# Patient Record
Sex: Female | Born: 1945 | Race: White | Hispanic: No | Marital: Single | Smoking: Never smoker
Health system: Southern US, Community
[De-identification: ages and names within clinical notes are randomized; demographics above are authoritative.]

## PROBLEM LIST (undated history)

## (undated) DIAGNOSIS — C569 Malignant neoplasm of unspecified ovary: Secondary | ICD-10-CM

## (undated) DIAGNOSIS — C801 Malignant (primary) neoplasm, unspecified: Secondary | ICD-10-CM

## (undated) DIAGNOSIS — I219 Acute myocardial infarction, unspecified: Secondary | ICD-10-CM

## (undated) DIAGNOSIS — E785 Hyperlipidemia, unspecified: Secondary | ICD-10-CM

## (undated) HISTORY — PX: TOTAL ABDOMINAL HYSTERECTOMY W/ BILATERAL SALPINGOOPHORECTOMY: SHX83

## (undated) HISTORY — DX: Acute myocardial infarction, unspecified: I21.9

## (undated) HISTORY — DX: Hyperlipidemia, unspecified: E78.5

## (undated) HISTORY — PX: TOTAL HIP ARTHROPLASTY: SHX124

---

## 2007-04-10 ENCOUNTER — Ambulatory Visit: Admission: RE | Admit: 2007-04-10 | Discharge: 2007-04-10 | Payer: Self-pay | Admitting: Orthopedic Surgery

## 2007-08-16 ENCOUNTER — Encounter (HOSPITAL_COMMUNITY): Admission: RE | Admit: 2007-08-16 | Discharge: 2007-08-27 | Payer: Self-pay | Admitting: Orthopedic Surgery

## 2007-09-11 ENCOUNTER — Inpatient Hospital Stay (HOSPITAL_COMMUNITY): Admission: RE | Admit: 2007-09-11 | Discharge: 2007-09-15 | Payer: Self-pay | Admitting: Orthopedic Surgery

## 2007-12-20 ENCOUNTER — Inpatient Hospital Stay (HOSPITAL_COMMUNITY): Admission: EM | Admit: 2007-12-20 | Discharge: 2007-12-22 | Payer: Self-pay | Admitting: Emergency Medicine

## 2007-12-20 ENCOUNTER — Ambulatory Visit: Payer: Self-pay | Admitting: Critical Care Medicine

## 2007-12-21 ENCOUNTER — Encounter (INDEPENDENT_AMBULATORY_CARE_PROVIDER_SITE_OTHER): Payer: Self-pay | Admitting: Internal Medicine

## 2008-01-02 ENCOUNTER — Ambulatory Visit: Payer: Self-pay | Admitting: Physical Medicine & Rehabilitation

## 2008-01-02 ENCOUNTER — Inpatient Hospital Stay (HOSPITAL_COMMUNITY)
Admission: RE | Admit: 2008-01-02 | Discharge: 2008-01-15 | Payer: Self-pay | Admitting: Physical Medicine & Rehabilitation

## 2008-02-05 ENCOUNTER — Ambulatory Visit: Admission: RE | Admit: 2008-02-05 | Discharge: 2008-02-05 | Payer: Self-pay | Admitting: Gynecologic Oncology

## 2008-05-20 ENCOUNTER — Ambulatory Visit: Admission: RE | Admit: 2008-05-20 | Discharge: 2008-05-20 | Payer: Self-pay | Admitting: Gynecologic Oncology

## 2008-08-22 ENCOUNTER — Encounter (HOSPITAL_COMMUNITY): Admission: RE | Admit: 2008-08-22 | Discharge: 2008-11-20 | Payer: Self-pay | Admitting: Gynecologic Oncology

## 2008-09-06 IMAGING — CR DG HIP 1V PORT*R*
1 series · 1 of 1 positions shown · non-contrast
Comparison: None.

CLINICAL DATA: Right hip osteoarthritis.  Post-op right total hip replacement.  
 PORTABLE RIGHT HIP - 1 VIEW:

[view not recorded]
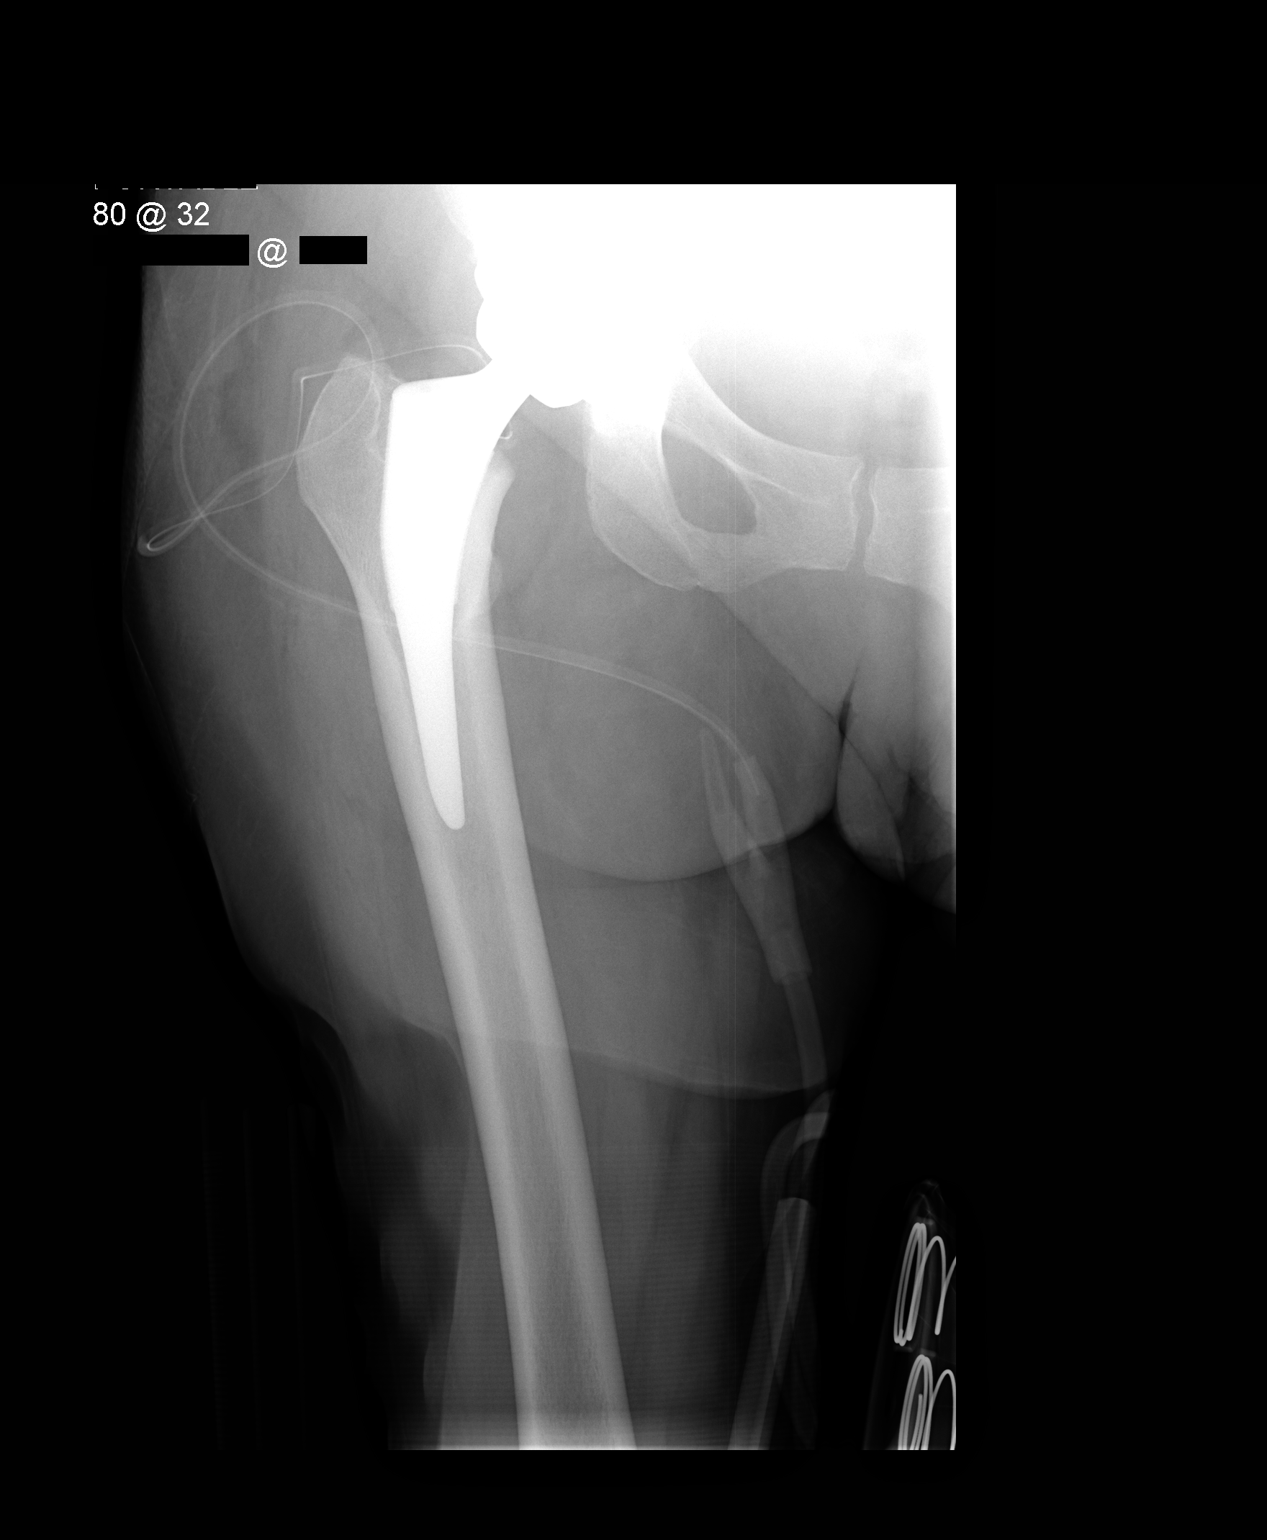

[1 of 1 positions shown; findings below may reference images not displayed]

FINDINGS: The patient is status post right total hip replacement with a screw-fixed acetabular component. The hardware appears well positioned.  There is no evidence of acute fracture or dislocation.  Surgical drain is in place.
IMPRESSION: No demonstrated complication following right total hip replacement. 
 AP PELVIS - 1 VIEW:
FINDINGS: The upper pelvis is excluded from this portable radiograph.  No acute fracture or dislocation is seen.  Post-surgical changes related to right total hip replacement are described above.
IMPRESSION: No acute pelvic findings.

## 2008-09-18 ENCOUNTER — Ambulatory Visit (HOSPITAL_COMMUNITY): Admission: RE | Admit: 2008-09-18 | Discharge: 2008-09-18 | Payer: Self-pay | Admitting: Gynecologic Oncology

## 2008-10-28 ENCOUNTER — Ambulatory Visit: Admission: RE | Admit: 2008-10-28 | Discharge: 2008-10-28 | Payer: Self-pay | Admitting: Gynecologic Oncology

## 2008-12-16 IMAGING — CR DG CHEST 1V PORT
1 series · 1 of 1 positions shown · non-contrast
Comparison: 12/21/07.

CLINICAL DATA: Status-post intubation. 
 PORTABLE CHEST - 1 VIEW ? 12/21/07:

[view not recorded]
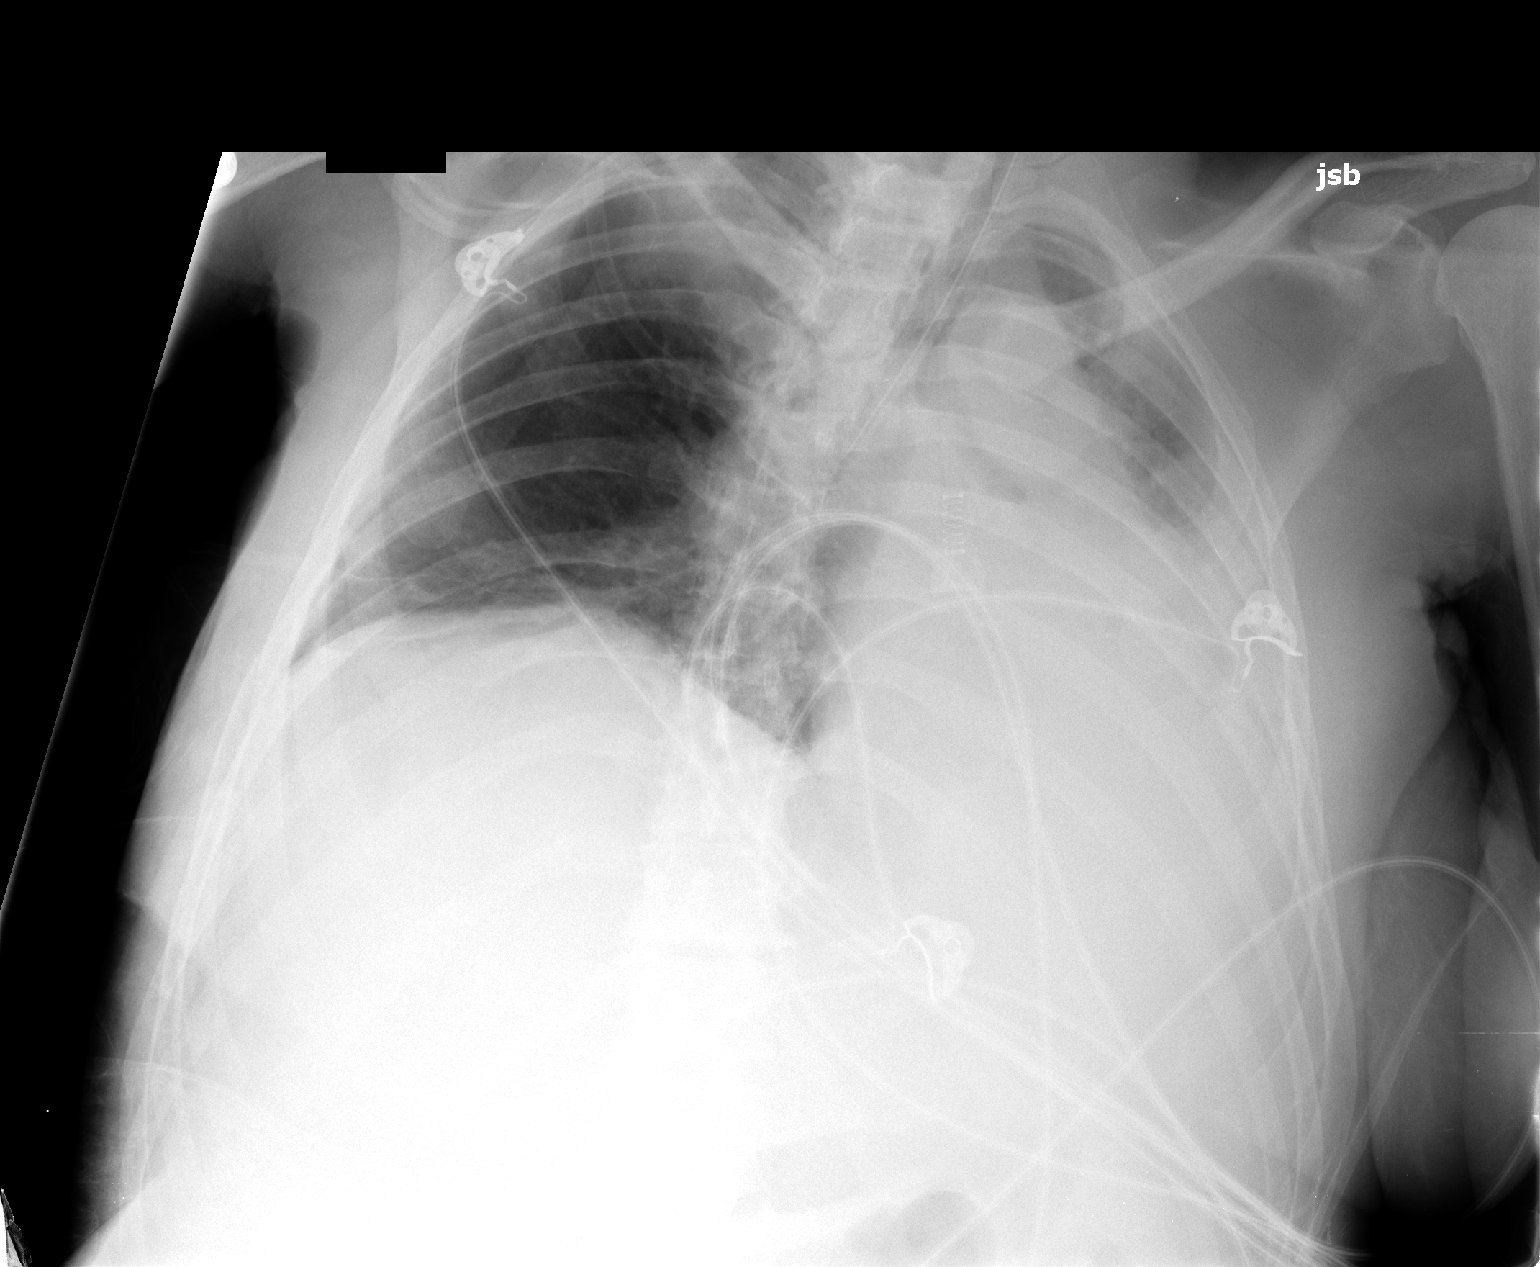

[1 of 1 positions shown; findings below may reference images not displayed]

FINDINGS: ET tube tip is in the right mainstem bronchus.  Recommend withdrawing by approximately 4 cm.  
 The heart size remains enlarged.  
 There is a left pleural effusion which has increased in the interval and there is subsequent decreased aeration to the left lung.  Atelectasis at the right base is noted.
IMPRESSION: 1.  Right mainstem bronchus intubation. 
 2.  Worsening aeration to the left lung with increased pleural effusion.

## 2008-12-16 IMAGING — CR DG CHEST DECUBITUS*L*
1 series · 1 of 1 positions shown · non-contrast
Comparison: Acute abdominal series, 12/20/2007

CLINICAL DATA: Abdominal pain and fever.
 CHEST LEFT DECUBITUS ? 1 VIEW:

[w chest decub.]
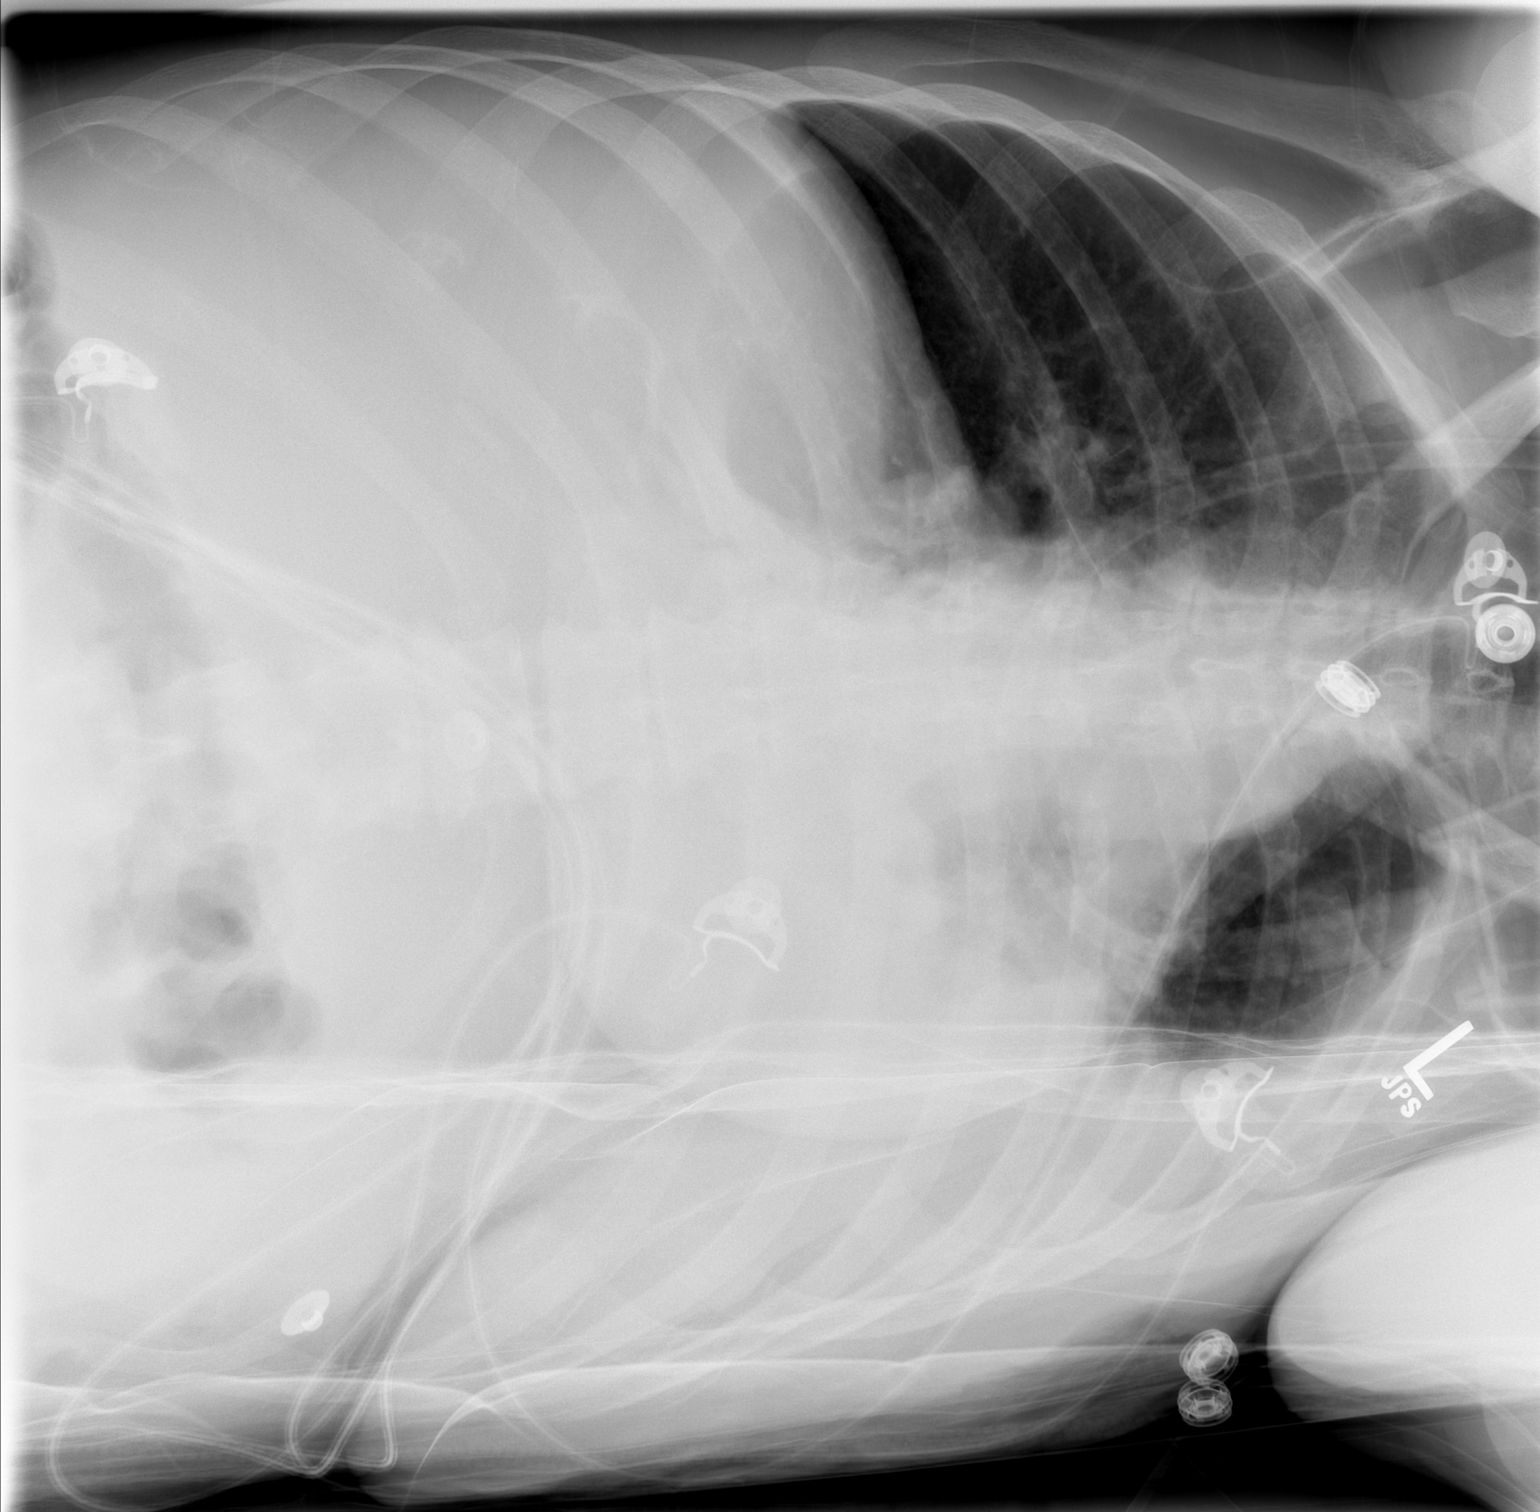

[1 of 1 positions shown; findings below may reference images not displayed]

FINDINGS: A left lateral decubitus chest x-ray demonstrates a mobile left effusion, which is moderate in size.  No evidence of pneumothorax. No evidence of free air on this chest x-ray.
IMPRESSION: Moderate mobile left pleural effusion.

## 2008-12-16 IMAGING — CR DG CHEST 1V PORT
1 series · 1 of 1 positions shown · non-contrast
Comparison: Portable chest x-ray earlier in the [AGE] hours.

CLINICAL DATA: Central venous catheter placement. Endotracheal tube
repositioning.

PORTABLE CHEST - 1 VIEW  [DATE]/2336 4030 hours:

[view not recorded]
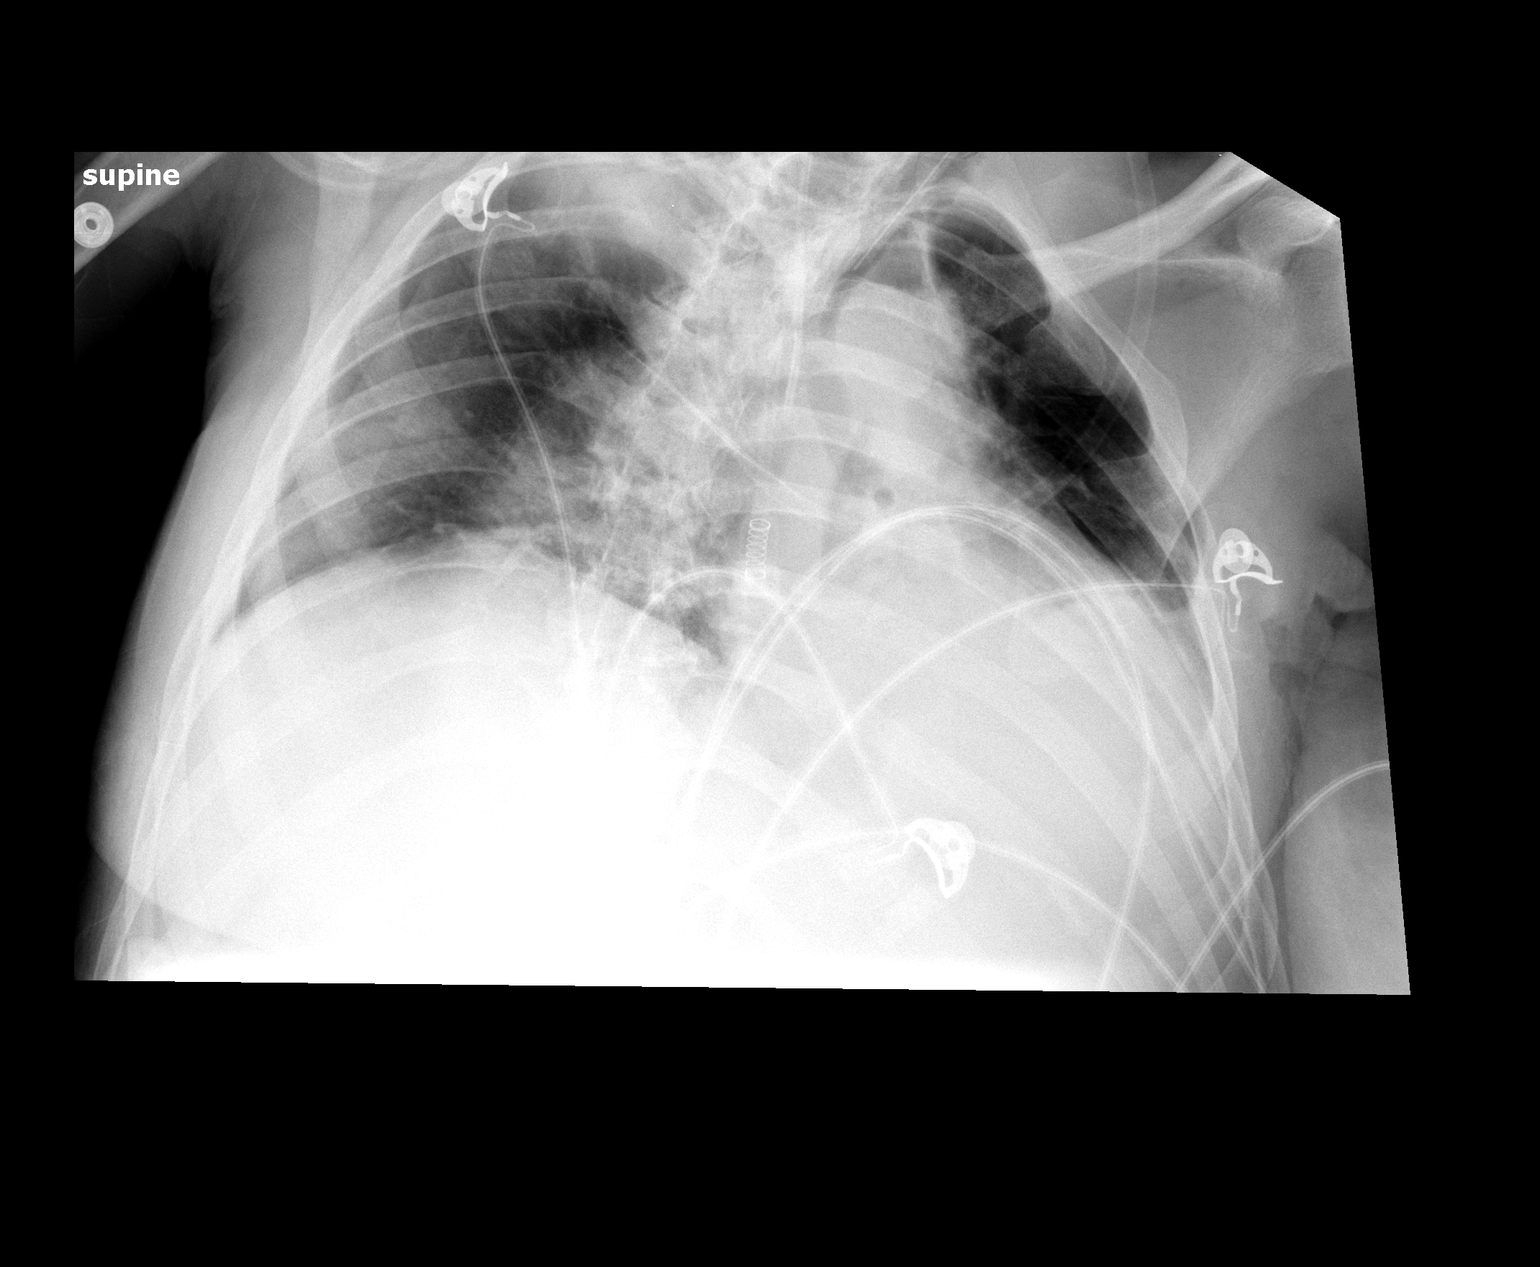

[1 of 1 positions shown; findings below may reference images not displayed]

FINDINGS: Endotracheal tube now in satisfactory position 3-4 cm above the
carina. Right jugular central venous catheter tip in the upper SVC. No evidence
of pneumothorax or mediastinal hematoma. Dense left lower lobe consolidation
with air bronchograms and airspace consolidation in the right upper lobe
unchanged. No new pulmonary parenchymal abnormalities. Heart enlarged. Patient
severely rotated to the left.
IMPRESSION: 1. Right jugular central venous catheter tip in the SVC. No acute complicating
features.
2. Endotracheal tube now in satisfactory position 3-4 cm above the carina. 
3. Stable dense left lower lobe atelectasis and/or pneumonia as well as right
upper lobe pneumonia.

## 2011-04-12 NOTE — Op Note (Signed)
NAMEJAMILETH, PUTZIER             ACCOUNT NO.:  [id number]   MEDICAL RECORD NO.:  [id number]          PATIENT TYPE:  INP   LOCATION:  1601                         FACILITY:  [facility]   PHYSICIAN:  Madlyn Frankel. Charlann Boxer, M.D.  DATE OF BIRTH:  1946/08/18   DATE OF PROCEDURE:  09/11/2007  DATE OF DISCHARGE:                               OPERATIVE REPORT   PREOPERATIVE DIAGNOSIS:  Right hip osteoarthritis.   POSTOPERATIVE DIAGNOSIS:  Right hip osteoarthritis.   FINDINGS:  The patient noted have the severe arthritis of the right hip  with near ankylosis of the joint.   PROCEDURE:  Right total hip replacement utilizing a DePuy hip system  with size 52 Gription Pinnacle cup multi-hole with a 36 neutral metal  liner size 1 high offset Trilock stem with a 36 1.5 Delta ceramic ball.   SURGEON:  Madlyn Frankel. Charlann Boxer, M.D.   ASSISTANT:  Dwyane Luo   ANESTHESIA:  General.   BLOOD LOSS:  300 mL.   DRAINS:  Times one.   COMPLICATIONS:  None.   INDICATIONS FOR PROCEDURE:  Ms. Rinck is a 65 year old female  presented to the office on worker's comp evaluation after injury to the  right hip.  She was diagnosed as post-traumatic arthritis with advanced  degenerative changes to the hip joint resulted in severe limitation of  functional capacity.  She in effort to alleviate symptoms discussed  surgical procedures.  Plan was to perform hip replacement surgery.  Unfortunately this was delayed due to medical issues and noted to be  significantly anemic.  This was corrected prior to surgery, risks of  infection, DVT, component failure, need for revision surgery as well as  persistent discomfort were all reviewed.  Consent was obtained.   PROCEDURE IN DETAIL:  The patient was brought to operative theater.  Once adequate anesthesia preoperative antibiotics, Ancef, administered,  the patient was positioned in the left lateral decubitus position with  the right side up.  The right lower extremity was prepped and  draped in  sterile fashion following pre scrub.  Lateral based incision was made  for posterior approach to the hip.  Iliotibial band and gluteus fascia  incised posteriorly.  The short external rotators and posterior capsule  noted to be extremely inflamed and adherent to one another.  An L  capsulotomy was made.  Her hip anatomy was noted to be severely abnormal  due to significant erosive changes she had.  There was very little  motion of the hip joint initially.  For this reason once I had obtained  exposure enough for direct visualization of femoral head and neck, I was  unable to dislocate without the concern for major complicating features.  For that reason I made and an in situ neck cut through the femoral neck.  This allowed for mobilization of the femur.  With this neck cut I tended  to the femur with hopes that I would increase its mobility to allow for  further mobility to remove that femoral head from the acetabulum.   Femoral neck was exposed and I used the drill, the hand reamer and  irrigated the canal.  I began broaching with a 0 broach and then to a  size 1 to get the level of my neck cut.  She was noted  to have a valgus  neck on the contralateral hip with the head center was higher than the  trochanter and this was taken into account with my neck preparation.  Following this broaching, I packed the sponge into the femur and  attended to the acetabulum.  At this point using a bone hook, I was able  to anteriorly translate and the femoral shaft anteriorly enough in order  to expose the acetabulum.  Following debridement of adherent capsular  tissue circumferentially to the femoral head, I was able to remove  femoral head without complication.  Despite radiographic concerns for  medial wall fracture, the pelvis was noted be in continuity by palpation  and direct observation.  Following debridement of the labral capsule  junction that was noted to be very adherent and  inflamed, I began  reaming with a 43 reamer and reamed all the way up to a 51 reamer which  gave me good bony contact circumferentially.  I irrigated the prepared  bone and chose to use a 52 Gription cup based on the fact that her bone  was very sclerotic.  This was impacted with an excellent scratch fit and  20 degrees of forward flexion and 35 to 40 degrees of abduction.  Two  screws were placed in the ilium.  Trial liner was placed in a 36  neutral.  At this point the trial 1 broach was placed with a high offset  neck.  With the 1 broach in place a trial reduction was carried out.  The patient was noted preoperatively to be significantly shortened on  this right lower extremity compared to her left.  She developed a  flexion contracture in the left knee as well.  Following this trial  reduction, she was noted to be snug in her right hip.  Her leg length  seemed to be significantly improved from her preoperative state.  The  hip was very stable throughout range of motion without complicating  features.  At this point, all trial components removed.  The final 36  metal liner was impacted into a clean and prepared acetabular shell.  The final one Tri-Lock stem was impacted to the neck cut where the  broach was.  For this reason I chose a 36 1.5 ball, hip was reduced  irrigated again.  I reapproximated posterior capsular tissue to the  superior leaflet, placed the medium Hemovac drain deep.  I  reapproximated the iliotibial band using #1 Ethibond.  #1 Vicryl was  used the gluteal fascia.  Remainder of wound was closed in 2-0 Vicryl  and a running 4-0 Monocryl.  The patient's hip was cleaned, dried and  dressed sterilely with Steri-Strips and sterile dressing.  She was  brought to recovery room extubated in stable condition.      Madlyn Frankel Charlann Boxer, M.D.  Electronically Signed     MDO/MEDQ  D:  09/11/2007  T:  09/12/2007  Job:  [id number]

## 2011-04-12 NOTE — Consult Note (Signed)
NAMEANTRICE, Jennifer Watkins             ACCOUNT NO.:  [id number]   MEDICAL RECORD NO.:  [id number]          PATIENT TYPE:  OUT   LOCATION:  GYN                          FACILITY:  [facility]   PHYSICIAN:  De Blanch, M.D.DATE OF BIRTH:  1946-10-05   DATE OF CONSULTATION:  DATE OF DISCHARGE:  02/05/2008                                 CONSULTATION   CHIEF COMPLAINT:  Postoperative follow-up.   INTERVAL HISTORY:  The patient returns today in postoperative follow-up  having undergone exploratory laparotomy for a large abdominal pelvic  mass on December 23, 2007, a [facility].  She  was found to have a 23 cm tumor arising from the left ovary which on  final pathology was found to be a granulosa cell tumor.  Other biopsies  and surgical staging found this to be a stage IA lesion with no evidence  of extra ovarian spread or capsule involvement or washings.  The patient  has had remarkably uncomplicated postoperative course, although she was  very ill perioperatively.  It is noted that serum CA-125 was 999  units/mL on January 26 and the inhibin (total) was 1067 on February 13.  CEA was 1.0.  The patient reports that she is returned to full levels of  activity and feels quite good.  She really has poor recall as to events  surrounding her surgery given her very poor overall physical condition.   PAST MEDICAL HISTORY:  Past history of anemia.   PAST SURGICAL HISTORY:  1. Right total hip replacement October 2008.  2. Ovarian cancer debulking and staging January 2009.   SOCIAL HISTORY:  The patient is single.  She lives alone.  She works as  a Conservation officer, nature at [employer].   DRUG ALLERGIES:  VIOXX.   FAMILY HISTORY:  Positive for breast cancer, lymphoma and diabetes.   REVIEW OF SYSTEMS:  A 10-point comprehensive review of systems negative  except as noted above.   PHYSICAL EXAMINATION:  VITAL SIGNS:  Height 5 feet for weight 129  pounds, blood pressure  140/84, pulse 100, respiratory rate 20.  GENERAL:  The patient is a healthy white female in no acute distress.  HEENT:  Negative.  NECK:  Supple without thyromegaly.  There is no supraclavicular or  inguinal adenopathy.  ABDOMEN:  Soft, nontender.  No mass, organomegaly, ascites or hernias  noted.  Midline incision is well-healed.  PELVIC:  EG/BUS vagina, urethra are normal.  The vaginal cuff is healing  well.  Uterus and cervix are surgically absent.  Adnexa without masses.  Rectovaginal exam confirms.  LOWER EXTREMITIES:  Without edema or varicosities.   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  The patient  has had an excellent postoperative recovery and is given the okay to  return to full levels of activity.  I believe it is too close to surgery  to obtain a repeat tumor markers today.   We will have the patient return to see Dr. Kyla Balzarine in 3 months, and at  that time plan on repeating total inhibin and CA-125.  De Blanch, M.D.  Electronically Signed     DC/MEDQ  D:  02/05/2008  T:  02/07/2008  Job:  [id number]   cc:   Ladell Pier, M.D.   Leonie Man, M.D.  [address]  [address]  [place]  [place] [address]   Ranelle Oyster, M.D.  Fax: [phone]   Telford Nab, R.N.  [address]  [place], [place] [address]

## 2011-04-12 NOTE — Consult Note (Signed)
Jennifer Watkins, Jennifer Watkins             ACCOUNT NO.:  [id number]   MEDICAL RECORD NO.:  [id number]          PATIENT TYPE:  OUT   LOCATION:  GYN                          FACILITY:  [facility]   PHYSICIAN:  John T. Kyla Balzarine, M.D.    DATE OF BIRTH:  Jan 19, 1946   DATE OF CONSULTATION:  10/28/2008  DATE OF DISCHARGE:                                 CONSULTATION   CHIEF COMPLAINT:  Follow up of granulosa cell tumor of the ovary.   HISTORY OF PRESENT ILLNESS:  This patient underwent laparotomy with  TAH/BSO at [facility] in 2009.  She had abdominal compartment syndrome because  of the large solid tumor.  Other biopsies and surgical staging found  this to be a stage IA granulosa cell tumor lesion with no evidence of  extra ovarian spread, capsular involvement or washings.  She has been  followed conservatively.  Her admission CA125 value was approximately  1000 U/ML and total inhibin was in access of 1067.  Currently, the  patient is doing quite well.  She notes a small amount of subumbilical  bulging.  She denies abdominal pain, early satiety or bloating, change  in bowel or bladder function, pelvic pain or leg swelling.   PAST MEDICAL HISTORY:  Remote anemia.   PAST SURGICAL HISTORY:  1. Right total knee replacement.  2. Ovarian cancer debulking/staging as above.   CURRENT MEDICATIONS:  None.   ALLERGIES:  VIOXX.   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  Nonsmoker, social ethanol.   FAMILY HISTORY:  Breast cancer in several members of her family, but no  ovarian or other gynecologic malignancies.   REVIEW OF SYSTEMS:  Otherwise 10-point review negative.   PHYSICAL EXAMINATION:  VITAL SIGNS:  Stable and afebrile as reflected in  the clinic notes.  GENERAL:  The patient is alert and oriented x3 in no acute distress.  LYMPH SURVEY:  No pathologic lymphadenopathy.  BACK:  No spinous or CVA tenderness.  ABDOMEN:  Scaphoid, soft and benign with well-healed incision.   No  tenderness, ascites, mass, organomegaly.  There is an easily reducible 4  cm subumbilical hernia.  EXTREMITIES:  Full strength and range of motion  with no edema, cords or Homan's.  PELVIC:  External genitalia, BUS,  bladder and urethra are normal.  Bimanual and rectovaginal examinations  reveal absent uterus and cervix.   DIAGNOSTICS/LABORATORY DATA:  Recent labs reviewed.  She had negative  mammograms in October 2009.  Inhibin A was less than 1 with CA125 value  6.6.  It should be noted that she had an extremely high TSH and this  needs to be followed by her family physician.   ASSESSMENT:  Granulosa cell tumor of the ovary.   PLAN:  We can continue to alternate followup at 6 month intervals as her  risk for recurrence has dropped off considerably, such that I could see  her once a year.      John T. Kyla Balzarine, M.D.  Electronically Signed     JTS/MEDQ  D:  10/28/2008  T:  10/29/2008  Job:  [id number]   cc:   Telford Nab, R.N.  [address]  [place], [place] [address]   Huel Cote, M.D.  Fax: [phone]

## 2011-04-12 NOTE — Consult Note (Signed)
Jennifer Watkins, Jennifer Watkins             ACCOUNT NO.:  [id number]   MEDICAL RECORD NO.:  [id number]          PATIENT TYPE:  INP   LOCATION:  2912                         FACILITY:  [facility]   PHYSICIAN:  Felipa Evener, MD  DATE OF BIRTH:  08/26/1946   DATE OF CONSULTATION:  DATE OF DISCHARGE:  12/22/2007                                 CONSULTATION   REASON FOR CONSULTATION:  Respiratory clearance prior to transfer to  [facility].   HISTORY OF PRESENT ILLNESS:  The patient is a 65 year old female with no  significant past medical history who presents to [facility]  from the [facility] with the chief complaint of abdominal distention and  abdominal pain.  We plan on evaluating the patient.  The patient unable  to give a significant amount of history due to severe abdominal pain,  however, from the chart was able to gather the information that the  patient has been having abdominal distention for approximately two weeks  with some shortness of breath and severe pain on mobility but no other  significant symptoms after interviewing the patient.  She recently had a  CT scan of the abdomen that showed a large distended abdomen at  [facility] with enlargement of 20 x 20 cm pelvic mass that  appears to be arising from the retroperitoneum.  The patient reported  that she has been very uncomfortable when standing or when sitting up.  The most comfortable position for her is lying down.  She also has been  having episodes of diarrhea but no other significant medical  abnormalities.   PAST MEDICAL HISTORY:  Significant for chronic anemia.   PAST SURGICAL HISTORY:  Right hip replacement in October of 2008 with  the patient needing another hip replacement on the other side due to  degenerative joint disease.   CURRENT MEDICATION AT HOME:  Vicodin and Ciprofloxacin.   ALLERGIES:  THE PATIENT IS ALLERGIC TO VIOXX, REACTION UNKNOWN.   SOCIAL HISTORY:  She is unmarried, general  disability due to  osteoarthritis.  No history of tobacco, alcohol, or drug abuse.  No  significant occupational history.   FAMILY HISTORY:  Father deceased due to unknown cause, mother expired at  the age of 3 with Alzheimer's.  Mother had breast cancer.  She has a  living brother that has prostatic cancer but otherwise is in good  health.   REVIEW OF SYSTEMS:  A 12-point review of systems was performed and was  negative other than mentioned in the HPI.   PHYSICAL EXAMINATION:  GENERAL:  Clearly uncomfortable-appearing female,  resting comfortably, is in bed in no acute distress.  VITAL SIGNS:  Temperature is 97.9, heart rate is 120, respiratory rate  is 35, O2 saturation 97% on 40% mask, and a blood pressure of 144/92.  HEENT:  Normocephalic, atraumatic, pupils are equal and reactive to  light, extraocular movements are intact.  Oral and nasal mucosa are  within normal limits.  NECK:  No thyromegaly, lymphadenopathy _______  HEART:  Regular rate and rhythm, no S1, S2, no murmurs, rubs or gallops.  LUNGS:  With decreased  breath sounds at the bases, dullness to  percussion at the left base.  ABDOMEN:  Soft.  It is severely distended, very tense, very tender to  palpation diffusely with a palpable mass, however, the patient is unable  to tolerate the pain for letting me fully palpate the mass.  EXTREMITIES:  No edema, no  _________  NEUROLOGIC EXAM:  Grossly intact.  SKIN EXAM:  The patient is jaundiced slightly.   LABORATORY DATA:  Were all reviewed.  Significant for a sodium of 133,  potassium 5.1, chloride of 100, bicarb of 21, glucose 145, BUN 22,  creatinine 1.1.  Calcium is 8.3, total protein 5.9, albumin 1.8, AST is  20, ALT is 9, LVH is 543, total bilirubin is 1, alkaline phosphatase is  96.  White blood cell count is 29.9, hemoglobin is 8.2, hematocrit is  25, platelets 731.  Chest x-ray revealed left-sided effusion and  significant abdominal distention.   ASSESSMENT:   1. The patient is a 65 year old female placed on the medication      mentioned above who is to be transferred to the ___________      surgical oncology service at [facility].  However, given the respiratory      rate and the respiratory status, I am very concerned that the      patient will not be able to maintain her airway for trips      especially that it is an hour an a half trip.  Most likely, we will      try to air lift the patient.  This time, we will sedate the patient      and intubate her, place central arterial line with continuous      Rocephin and Zithromax at this point.  Since the patient is      afebrile, white blood cell count source is currently unclear.      Perform a thoracentesis and we will send fluid for fluid analysis      as well as cytology.  Chest x-ray will be ordered after ordered      procedure.  After that, the patient will be cleared for transfer to      [facility].   Thank you for allowing [place] to participate in this patient's care.  Will  continue to follow along with you.      Felipa Evener, MD  Electronically Signed     WJY/MEDQ  D:  12/21/2007  T:  12/21/2007  Job:  [id number]

## 2011-04-12 NOTE — H&P (Signed)
[name], [name]             ACCOUNT NO.:  [id number]   MEDICAL RECORD NO.:  [id number]         PATIENT TYPE:  LINP   LOCATION:                               FACILITY:  [facility]   PHYSICIAN:  Jennifer Watkins, M.D.  DATE OF BIRTH:  May 05, 1946   DATE OF ADMISSION:  09/11/2007  DATE OF DISCHARGE:                              HISTORY & PHYSICAL   PROCEDURE:  Right total hip arthroplasty.   CHIEF COMPLAINT:  Right hip and groin pain.   HISTORY OF PRESENT ILLNESS:  This is a 65 year old female with a history  of persistent and progressive right hip pain secondary to  osteoarthritis.  It has been refractory to all conservative treatment.  She had been scheduled for surgery in the past, was discovered to have  anemia.  She has since had that corrected and her most recent hemoglobin  reading was 12 after recent transfusion of two units of packed red blood  cells.  She had been closely followed and well managed by Dr. Benedetto Goad.   PAST MEDICAL HISTORY:  Significant for:  1. Osteoarthritis.  2. Anemia.   PAST SURGICAL HISTORY:  None.   FAMILY HISTORY:  Heart disease, diabetes, cancer.   SOCIAL HISTORY:  Lives alone.  Does have 18 steps to enter home.   DRUG ALLERGIES:  VIOXX causes swelling.   MEDICATIONS:  Iron supplements.   REVIEW OF SYSTEMS:  None other than HPI.   PHYSICAL EXAMINATION:  Pulse 64, respirations 18, blood pressure 118/76.  GENERAL:  Awake, alert and oriented, well-developed, well-nourished, no  acute distress.  Does use 2 canes for ambulation.  NECK:  Supple.  No carotid bruits.  CHEST:  Lungs clear to auscultation bilaterally.  BREASTS:  Deferred.  HEART:  Regular rate and rhythm without murmurs.  ABDOMEN:  Soft, nontender, nondistended.  Bowel sounds present.  GENITOURINARY:  Deferred.  EXTREMITIES:  Right hip has increased pain with range of motion.  SKIN:  No cellulitis.  Dorsalis pedis pulse positive.  NEUROLOGIC:  Intact distal sensibilities.   LABORATORIES:  Most recent CBC shows a hemoglobin of 12.  EKG shows  normal sinus rhythm.  Chest x-ray pending presurgical testing on the  13th.   IMPRESSION:  1. Right hip osteoarthritis.  2. Anemia.   PLAN OF ACTION:  Right total hip arthroplasty.  Risks and complications  discussed.  Postoperative medications including Lovenox, Robaxin,  MiraLax, Colace provided at time of history and physical.  Pain  medications will be provided at time of surgery.     ______________________________  Jennifer Watkins      Jennifer Watkins, M.D.  Electronically Signed    BLM/MEDQ  D:  09/07/2007  T:  09/07/2007  Job:  [id number]   cc:   Gloriajean Dell. Andrey Campanile, M.D.  Fax: [phone]

## 2011-04-12 NOTE — Consult Note (Signed)
NAMEDAISEE, CENTNER             ACCOUNT NO.:  [id number]   MEDICAL RECORD NO.:  [id number]          PATIENT TYPE:  OUT   LOCATION:  GYN                          FACILITY:  [facility]   PHYSICIAN:  John T. Kyla Balzarine, M.D.    DATE OF BIRTH:  October 01, 1946   DATE OF CONSULTATION:  05/20/2008  DATE OF DISCHARGE:                                 CONSULTATION   CHIEF COMPLAINT:  Follow-up of ovarian granulosa cell tumor.   HISTORY OF PRESENT ILLNESS:  This patient underwent laparotomy with  TH/BSO at [facility] in January 2009.  She had been transferred in need of  intubation because of abdominal compartment syndrome and was explored  relatively emergently.  Other biopsies and surgical staging found this  to be stage IA lesion with no evidence of extra ovarian spread, capsular  involvement or washings.  It was elected to follow her conservatively.  Her admission CA-125 was 999 U/mL and total inhibin was 1067.  Currently, the patient has healed well from surgery and is at full  activity.  She has very little recall regarding events surrounding her  surgery, but states that she feels completely back to normal.  She  denies abdominal pain, early satiety or bloating, change in bowel or  bladder function, pelvic pain or leg swelling.   PAST MEDICAL HISTORY:  Remote anemia.   PAST SURGICAL HISTORY:  1. Right total knee replacement in November 2008.  2. An ovarian cancer debulking/staging in January 2009.   MEDICATIONS:  Currently none.   ALLERGIES:  VIOXX.   PERSONAL AND SOCIAL HISTORY:  Single and works as a Conservation officer, nature at [employer].  Nonsmoker, social ethanol.   FAMILY HISTORY:  Breast cancer in several family members; no ovarian or  other gynecologic malignancies.   REVIEW OF SYSTEMS:  Normal in a 10 system review.   PHYSICAL EXAMINATION:  VITAL SIGNS:  Blood pressure 140/84, weight 129  pounds.  Other vital signs stable.  GENERAL:  The patient is alert and oriented x3, in no acute distress.  LYMPH NODE SURVEY:  No pathologic lymphadenopathy.  BACK:  No spinous or CVA tenderness.  ABDOMEN:  Well-healed midline incision extending into the epigastrium  with no hernia.  There is modest keloid.  No ascites, tenderness or  mass.  EXTREMITIES:  Full strength and range of motion with no cords, clubbing  or edema.  BACK:  No spinous or CVA tenderness.  PELVIC:  External genitalia and BUS, bladder and urethra are normal.  Vagina is clear.  Bimanual and rectovaginal examinations disclose absent  uterus and cervix with no mass or nodularity.   CA-125 value is 11.5 and inhibin B is less than 10.  Total inhibin and  inhibin A are pending.   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  We would recommend long-term surveillance and would initially alternate  follow-up Dr. Huel Cote at approximately 40-month intervals such  that each of [place] sees her twice yearly.  Tumor markers would be performed  at each visit here.  I emphasized the biology of granulosa cell tumor  and the need for long-term follow-up.  Between 12-18 months following  surgery, she should have an abdominopelvic CT scan, and then would use  symptom directed scans.      John T. Kyla Balzarine, M.D.  Electronically Signed     JTS/MEDQ  D:  05/20/2008  T:  05/20/2008  Job:  [id number]   cc:   Huel Cote, M.D.  Fax: [phone]   Telford Nab, R.N.  [address]  [place], [place] [address]

## 2011-04-12 NOTE — H&P (Signed)
NAMEGAYATHRI, Jennifer Watkins             ACCOUNT NO.:  [id number]   MEDICAL RECORD NO.:  [id number]          PATIENT TYPE:  IPS   LOCATION:  4037                         FACILITY:  [facility]   PHYSICIAN:  Ranelle Oyster, M.D.DATE OF BIRTH:  Oct 07, 1946   DATE OF ADMISSION:  01/02/2008  DATE OF DISCHARGE:                              HISTORY & PHYSICAL   CHIEF COMPLAINTS:  Weakness.   HISTORY OF PRESENT ILLNESS:  This is a pleasant 65 year old white female  with osteoarthritis and anemia secondary to menorrhagia with a diagnosis  of pelvic mass prior to admission to [facility] on January 22.  She  developed shortness of breath and abdominal pain.  Chest x-ray revealed  pleural effusion with bilateral pneumonia.  She started antibiotics.  Thoracentesis was done on January 23.  She was intubated the same day  for worsening hypoxia and transferred to [facility] for further  treatment.  She underwent exploratory laparotomy with TAH and BSO with  peritoneal and omental biopsies on January 26 by Dr. Kyla Watkins.  She  developed a distal left lower extremity DVT treated with prophylactic  dose subcutaneous heparin.  She was extubated on January 29.  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  Therapy is ongoing, and the patient continues to have decreased  endurance, shortness of breath.  She is min to mod assist for basic  mobility and transfers.  She had a follow-up Doppler done Monday which  showed stable left peroneal soleus and posterior tibial thrombus.  GYN  was recommending continued subcu heparin until follow-up as an  outpatient.   REVIEW OF SYSTEMS:  Notable for the above.  She does report some ongoing  shortness of breath.  Pain is well-controlled.  She is moving her bowels  and bladder.  She has reasonable appetite.  Mood is good.  Full reviews  in the written H&P.   PAST MEDICAL HISTORY:  1. Positive for right total hip replacement in  October 2008.  She has      had some problems with osteoarthritis in her left hip as well.  She      has done very nicely, however, after the right hip replacement.  2. Anemia.   FAMILY HISTORY:  Positive for breast cancer, lymphoma, and diabetes.   SOCIAL HISTORY:  The patient is single.  Lives alone in a two level  house in [place] with 18 steps to enter.  She works as a Conservation officer, nature at  [employer].  She has a sister here who is visiting from [place]  who will assist in disposition planning.   FUNCTIONAL HISTORY:  The patient was independent prior to arrival and  working as noted above.  Currently she is min to mod assist with basic  mobility, transfers.   ALLERGIES:  VIOXX.   HOME MEDICATIONS:  Vicodin p.r.n.   LABORATORY DATA:  Hemoglobin 11.8, white count 10.1, platelets 444,000.  Sodium 138, potassium 3.1, BUN and creatinine 16 and 0.58.   PHYSICAL EXAMINATION:  GENERAL:  The patient is pleasant, alert and  oriented  x3.  She is sitting comfortably in a chair.  VITAL SIGNS:  Pulse approximately 80, respiratory rate 16.  HEENT:  Pupils equal, round, and reactive to light.  Ear, nose, and  throat exam is unremarkable with fair dentition and moist oral mucosa.  NECK:  Supple without JVD or lymphadenopathy.  CHEST:  Clear to auscultation bilaterally without wheezes, rales or  rhonchi.  HEART:  Regular rate and rhythm without murmurs, rubs or gallops.  ABDOMEN:  Soft, nontender.  Wound is well approximated with only slight  drainage distally.  Bowel sounds are positive, and she is minimally  tender.  SKIN:  Was generally intact elsewhere.  She has a right IJ catheter in  place.  NEUROLOGICAL:  Cranial nerves II-XII are intact.  Reflexes are 2+.  Sensation was normal.  Judgment, orientation, memory, and mood were all  within normal limits.  Motor function generally was 4/5 proximal in the  upper extremities to 5/5 distally.  Lower extremity strength was 3+ to  4/5  proximal to 4+/5 distal.   ASSESSMENT/PLAN:  1. Functional deficits secondary to prolonged hospital course.  The      patient was admitted for ovarian mass which was diagnosed as adult      type granulosa cell tumor.  She is status post TAH-BSO.  The      patient's course also complicated by pneumonia and pleural      effusions.  Begin comprehensive inpatient rehabilitation with      physical therapy to assess and treat for range of motion, mobility,      endurance, and gait.  Occupational therapy will assess and treat      for range of motion, activities of daily living, and household      activities.  Twenty-four hour rehabilitation nursing will follow      the patient for bowel, bladder, skin, medication, and nutritional      needs.  Rehabilitation case manager/social worker will assist in      psychosocial needs and discharge planning.  Estimated length of      stay 7+ days.  Goals modified independent.  Prognosis good.  2. Deep venous thrombosis:  Continue q.8 h. subcu Lovenox 5000 units.      I discussed with the gynecologist oncologist fellow at [facility]      with the plan being to follow up the left lower extremity at her      outpatient visit with another Doppler test.  The clot is distal,      and has not progressed over the last week's time.  3. Pain management with p.r.n. Percocet and Motrin.  4. Shortness of breath:  Check O2 sats and use oxygen as needed for      saturations less than 90%.  Check chest x-ray.  5. Anemia:  Multivitamin and iron.  6. Abdominal wound:  Continue to monitor.  Still with some serous      drainage at this point.      Ranelle Oyster, M.D.  Electronically Signed     ZTS/MEDQ  D:  01/02/2008  T:  01/03/2008  Job:  [id number]

## 2011-04-12 NOTE — Discharge Summary (Signed)
Jennifer Watkins, Jennifer Watkins             ACCOUNT NO.:  [id number]   MEDICAL RECORD NO.:  [id number]          PATIENT TYPE:  INP   LOCATION:  2912                         FACILITY:  [facility]   PHYSICIAN:  Ladell Pier, M.D.   DATE OF BIRTH:  May 01, 1946   DATE OF ADMISSION:  12/20/2007  DATE OF DISCHARGE:  12/21/2007                               DISCHARGE SUMMARY   NOTE:  The patient was admitted December 20, 2007, and transferred to  [facility] on December 21, 2007.   DISCHARGE DIAGNOSES:  1. Large abdominal mass, question retroperitoneal sarcoma.  2. Respiratory failure requiring intubation.  3. Pneumonia.  4. Bilateral pleural effusion.  5. Anemia.  6. Leukocytosis.  7. Thrombocytosis.  8. Hyponatremia.  9. Tachycardia.  10.Mild ascites.  11.Constipation.  12.Oral thrush.   DISCHARGE MEDICATIONS:  1. Rocephin 1 g IV q.24 h.  2. Zithromax 500 mg IV q.24 h.  3. Nystatin 5 mL swish and swallow b.i.d.  4. Protonix 40 mg daily.  5. Lovenox 40 mg subcu q.24 h.  6. Iron sulfate 325 mg b.i.d.  7. Dilaudid 0.5 to 1 mg q.4 h p.r.n.   CONSULTATIONS:  Pulmonary critical care; general surgery, Dr. Lurene Shadow;  gynecology, Dr. Senaida Ores.   PROCEDURES:  The patient was intubated today by CCM and also had A line  placed and she had a thoracentesis draining 500 mL of fluid done on  January 23.  All these procedures were on January 23.   HISTORY OF PRESENT ILLNESS:  The patient is a 65 year old female that  presented to the emergency department complaining of abdominal pain for  the past 10 days with bleeding from the vaginal area.  She first  presented to her primary care physician and had a CAT scan done that  showed an abdominal mass.  She was then referred to general surgery, Dr.  Lurene Shadow.  Saw Dr. Lurene Shadow and was scheduled to get an MRI.  She was not  able to complete the MRI secondary to inability to lie flat on her back  because of shortness of breath and pain.  She was to see Dr. Lurene Shadow  in  the office on Tuesday but because it snowed the office was closed and  her appointment was rescheduled.  She became increasingly short of  breath so she came to the emergency department.   PAST MEDICAL, FAMILY SOCIAL HISTORY, MEDICATIONS, ALLERGIES, REVIEW OF  SYSTEMS:  Per admission H and P.   PHYSICAL EXAMINATION:  VITAL SIGNS:  Temperature 98.4, pulse of 128,  blood pressure 130/34, pulse ox 94% on 50% face mask.  HEENT:  Normocephalic, atraumatic.  Pupils reactive to light.  Throat  without erythema.  CARDIOVASCULAR:  She is tachycardic.  LUNGS:  Crackles in the bases.  ABDOMEN:  Distended with positive bowel sounds.  EXTREMITIES:  Without edema.   HOSPITAL COURSE:  1. Pneumonia.  The patient was admitted to the hospital.  A chest x-      ray showed question of pneumonia.  She was started on antibiotics,      Rocephin and Zithromax.  She had no cough or  fevers at home but she      did have a fever, leukocytosis in the emergency department.  This      could be secondary to a pneumonia or it could be secondary to her      cancer.  She was started on Rocephin and Zithromax.  2. Pleural effusion.  On chest x-ray she was noted to have bilateral      pleural effusion that layers out.  CCM was consulted and they did a      thoracentesis and the fluid is sent for culture and cytology.  3. Anemia; the patient does have a history of anemia secondary to      menses.  She was restarted on iron.  She states that she has not      been taking her iron secondary to just feeling really bad and she      was transfused 1 unit of blood.  4. Leukocytosis; this is most likely secondary to infection versus her      abdominal mass/cancer.  Will monitor while treating with      antibiotics.  5. Thrombocytosis; this is an acute phase reaction secondary to      infection versus inflammation.  Will monitor.  6. 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  7. Tachycardia; she has a low albumin of 1.8 so she could be third      spacing and tachycardia could be secondary to volume depletion.  8. Mild ascites; secondary to cancer.  9. Abdominal mass; the patient will be transferred to tertiary      institution secondary to the abdominal mass/cancer.  10.Constipation; she was given daily bowel regiment of Senokot S.   DISCHARGE LABS:  Blood gas - pH 7.20, pCO2 of 55, pO2 of 45, bicarb of  21, oxygen saturation of 67%.  Anemia panel, RBC 3.24, TIBC 149, alpha-  fetoprotein less than 1.3, CEA less than 0.5, CA125 539.5, WBC from the  pleural fluid 3550, neutrophils segmented neutrophils 80, reactive  mesothelial cells, LDH in the fluid 1028, glucose 154 in the fluid.  Total protein in the fluid less than 3.  Albumin 1.3.  CMP, sodium 133,  potassium 5.1, chloride 100, CO2 21, glucose 145, BUN 22, creatinine  1.10, alkaline phosphatase 96, AST 20, ALT 9, total protein 5.9, albumin  1.8, calcium 8.3, LDH 543 in the blood.  Blood cultures negative x2.  Urine micro negative.  Lateral decubitus film shows moderate mobile left  pleural effusion.  Abdominal film showed normal bowel gas pattern,  question abdominal ascites, left greater than right pleural effusion and  left lower lobe atelectasis versus infiltrate.      Ladell Pier, M.D.  Electronically Signed     NJ/MEDQ  D:  12/21/2007  T:  12/21/2007  Job:  [id number]   cc:   Jennifer Watkins, M.D.

## 2011-04-12 NOTE — H&P (Signed)
Jennifer Watkins, Jennifer Watkins             ACCOUNT NO.:  [id number]   MEDICAL RECORD NO.:  [id number]          PATIENT TYPE:  INP   LOCATION:  1829                         FACILITY:  [facility]   PHYSICIAN:  Ladell Pier, M.D.   DATE OF BIRTH:  08-05-1946   DATE OF ADMISSION:  12/20/2007  DATE OF DISCHARGE:                              HISTORY & PHYSICAL   CHIEF COMPLAINT:  Shortness of breath.   HISTORY OF PRESENT ILLNESS:  The patient is a 65 year old white female  that stated that she has been having abdominal pain for the past 10  days. She presented to her primary care physician, Dr. Andrey Campanile, had a CT  scan done of the abdomen and pelvis that showed an abdominal mass. She  was referred to Dr. Lurene Shadow. Followed up with Dr. Lurene Shadow and was  scheduled for MRI but was unable to lie flat for the entire procedure.  She had half the test done. She was scheduled to see Dr Lurene Shadow, general  surgery, two days ago but because of this, her appointment was  cancelled. She was at home and because of the abdominal pain and  shortness of breath she came to the emergency room. She complains of no  nausea, vomiting, fevers or chills at home. She also states she recently  had a urinary tract infection and was treated with Cipro.   PAST MEDICAL HISTORY:  Significant for osteoarthritis, status post right  total hip arthroplasty. Also, anemia.   FAMILY HISTORY:  Mother died of breast cancer. Mother is deceased. She  died from Alzheimer disease. Father died from lymphoma.   SOCIAL HISTORY:  She lives alone. No tobacco or alcohol use.  She is not  married. She has no children. She works at [employer] and her  friends are with her in the ER today.   MEDICATIONS:  She was taking Vicodin p.r.n.   ALLERGIES:  VIOXX.   REVIEW OF SYSTEMS:  As per HPI.   PHYSICAL EXAMINATION:  VITAL SIGNS: Temperature 100.1, blood pressure  107/72, pulse 122, respirations 16. Pulse ox 90% on room air.  HEENT : Head  normocephalic, atraumatic. Pupils reactive to light. Throat  without erythema. She does have oral thrush.  CARDIOVASCULAR: Regular rate and rhythm.  LUNGS: She has crackles in the left base.  ABDOMEN: Severely distended, decreased bowel sounds, minimal tenderness.  EXTREMITIES: Without edema, 2+ __________ bilaterally.   LABORATORY DATA:  Chest x-ray shows left greater than right pleural  effusion, left lower lobe atelectasis versus infiltrate. Sodium 130,  potassium 4.7, chloride 97, C02,  22, glucose 139, BUN 24, creatinine  1.10, calcium 8.3, AST 23, lipase 14, white blood cell count 28.9,  hemoglobin 7.4, MCV 78.7, platelets 781.   ASSESSMENT/PLAN:  1. Pneumonia: We will admit patient to the hospital, start her on IV      antibiotics. We will do blood cultures.  2. Oral thrush. We will put her on Nystatin swish and swallow.  3. Pleural effusion. We will get left lateral decubitus film to      evaluate for __________ It if does __________  and is  large, we      will get a thoracentesis to see if that will help her shortness of      breath.  4. Anemia: We will start her back on iron and we will guaiac stools      and do anemia panel.  5. Abdominal mass: Dr. Lurene Shadow consulted to see patient for abdominal      pain/abdominal mass.      Ladell Pier, M.D.  Electronically Signed     NJ/MEDQ  D:  12/20/2007  T:  12/20/2007  Job:  [id number]

## 2011-04-15 NOTE — H&P (Signed)
NAMEENEIDA, EVERS             ACCOUNT NO.:  [id number]   MEDICAL RECORD NO.:  [id number]          PATIENT TYPE:  INP   LOCATION:  NA                           FACILITY[facility]   PHYSICIAN:  Madlyn Frankel. Charlann Boxer, M.D.  DATE OF BIRTH:  [birth year]   DATE OF ADMISSION:  04/17/2007  DATE OF DISCHARGE:                              HISTORY & PHYSICAL   PROCEDURE:  Right total hip replacement.   CHIEF COMPLAINTS:  Right hip pain.   HISTORY OF PRESENT ILLNESS:  This is a 65 year old female with a history  of persistent progressive hip pain.  It has been refractory to all  conservative treatments.  She had been followed closely in our practice  for quite some time and has seen progression of this hip arthritis and  diminished quality of life.  It has been refractory to all conservative  treatments including oral anti-inflammatories.  She is a relatively  healthy female who was seen preoperatively by Dr. Benedetto Goad.   PAST MEDICAL HISTORY:  Osteoarthritis.   PAST SURGICAL HISTORY:  None.   FAMILY HISTORY:  Heart disease, diabetes, cancer.   SOCIAL HISTORY:  She is single, lives alone with no relatives in [place].  She does have 18 steps required to go up into her home.   DRUG ALLERGIES:  VIOXX causes swelling.   MEDICATIONS:  None.   REVIEW OF SYSTEMS:  None other than HPI.   PHYSICAL EXAMINATION:  VITAL SIGNS:  Pulse 84, respirations 18, blood  pressure 120/82.  GENERAL:  She is awake, alert and oriented, well-developed, well-  nourished, no acute distress.  NECK:  Supple.  No carotid bruits.  CHEST/LUNGS:  Clear to auscultation bilaterally.  BREASTS:  Deferred.  HEART:  Regular rate and rhythm without gallops, clicks, rubs or  murmurs.  ABDOMEN:  Soft, nontender, nondistended.  Bowel sounds present.  GENITOURINARY:  Deferred.  EXTREMITIES:  Right lower extremity is painful to internal range of  motion as it causes increased groin pain.  SKIN:  Dorsalis pedis pulses  positive.  No cellulitis.  NEUROLOGIC:  Intact distal sensibilities right lower extremity.   LABORATORY DATA:  Labs pending.  EKG performed in Dr. Tawana Scale office  demonstrating a normal sinus rhythm.  Chest x-ray pending presurgical  clearance.   IMPRESSION:  Right hip osteoarthritis.   PLAN OF ACTION:  Right total hip replacement Apr 17, 2007 at [facility] by surgeon Dr. Durene Romans.  Risks and complications were  discussed.  Questions were encouraged, answered and reviewed.   There was questions as to her status postoperatively whether she would  be able to go home or whether she would require skilled nursing  facility.  Since this is a Financial controller comp case, Sports coach was consulted  and a decision process will take place after operation to see how her  progress is in the hospital.     ______________________________  Sharlet Salina [address], [place]      Madlyn Frankel. Charlann Boxer, M.D.  Electronically Signed    BLM/MEDQ  D:  04/11/2007  T:  04/11/2007  Job:  [id number]  cc:   Gloriajean Dell. Andrey Campanile, M.D.  Fax: [phone]

## 2011-04-15 NOTE — Discharge Summary (Signed)
Jennifer Watkins, Jennifer Watkins             ACCOUNT NO.:  [id number]   MEDICAL RECORD NO.:  [id number]          PATIENT TYPE:  INP   LOCATION:  1601                         FACILITY:  [facility]   PHYSICIAN:  Madlyn Frankel. Charlann Boxer, M.D.  DATE OF BIRTH:  07/22/1946   DATE OF ADMISSION:  09/11/2007  DATE OF DISCHARGE:  09/15/2007                               DISCHARGE SUMMARY   ADMISSION DIAGNOSES:  1. Osteoarthritis.  2. Anemia.   DISCHARGE DIAGNOSES:  1. Osteoarthritis.  2. Anemia.  3. Postoperative hyponatremia.   CONSULTATIONS:  None.   PROCEDURE:  Right total hip replacement.   SURGEON:  Madlyn Frankel. Charlann Boxer, M.D.   ASSISTANT:  Yetta Glassman. Mann, PA.   COMPONENTS:  Metal on ceramic.   HISTORY OF PRESENT ILLNESS:  A 65 year old female with a history of  right hip and groin pain secondary to osteoarthritis, refractory to all  conservative treatments.  She was discovered to have anemia, pretreated  with packed red blood cells and iron prior to surgery.  Hemoglobin had  come up to 12 and she was stable and cleared for surgery by Gloriajean Dell.  Andrey Campanile, M.D.   LABORATORY DATA:  Preadmission hemoglobin 12.  CBC monitored during  course of stay, at discharge hemoglobin 10, hematocrit 30.1.  Routine  chemistries; mild hyponatremia at 130, at discharge glucose 131, BUN 4  due to some mild dehydration.  Calcium 7.7 at discharge.  Otherwise  kidney well perfused.  Urine pregnancy negative.  UA showed small  leukocyte esterase.   Cardiology EKG normal sinus rhythm.   No chest x-ray found in chart.  Portable pelvis showed no acute findings  and no complications following right total hip replacement.   HOSPITAL COURSE:  The patient underwent right total hip replacement and  tolerated the procedure well.  She was moved to the orthopedic floor.  She progressed nicely during her course of stay.  She remained afebrile  throughout.  Her dressing was changed on a daily basis after  postoperative day #1 with no  significant drainage from the wound.  DVT  prophylaxis was started on postoperative day #1.  PT/OT was started as  well.  The patient did ambulate at least 50 feet prior to discharge and  progressed nicely.  As noted, her hemoglobin did remain stable at 10  prior to discharge.  She did have a mild fever on postoperative day #3  and so she remained in the hospital for one additional day to make sure  fever remained stable.  She consulted with care management about her  prescription and also the need for an ambulance to get her home and this  was verified and confirmed with worker's comp before she did so.   DISPOSITION:  Stable and improved condition.  Discharged home with home  health care, health aide, as well as PT/OT.  Discharged with ambulance  assistance.   DIET:  Regular.   WOUND CARE:  Keep wound dry.   DISCHARGE MEDICATIONS:  1. Lovenox 40 mg subcu q.24 hours x11 days.  2. Robaxin 500 mg p.o. q.6 hours.  3. Vicodin 5/325 mg one  to two p.o. q.4-6 hours p.r.n. pain.  4. Iron 325 mg p.o. t.i.d. x3 weeks or until further notified by      primary care.  5. Colace 100 mg p.o. b.i.d.  6. MiraLax 17 grams p.o. daily.   FOLLOWUP:  Follow up with Dr. Charlann Boxer at phone number [phone].     ______________________________  Yetta Glassman. [address], [place]      Madlyn Frankel. Charlann Boxer, M.D.  Electronically Signed    BLM/MEDQ  D:  10/09/2007  T:  10/09/2007  Job:  [id number]   cc:   Gloriajean Dell. Andrey Campanile, M.D.  Fax: [phone]

## 2011-04-15 NOTE — Discharge Summary (Signed)
Jennifer, Watkins             ACCOUNT NO.:  [id number]   MEDICAL RECORD NO.:  [id number]          PATIENT TYPE:  IPS   LOCATION:  4037                         FACILITY:  [facility]   PHYSICIAN:  Ranelle Oyster, M.D.DATE OF BIRTH:  [birth year]   DATE OF ADMISSION:  01/02/2008  DATE OF DISCHARGE:  01/15/2008                               DISCHARGE SUMMARY   DISCHARGE DIAGNOSES:  1. Deconditioning, past resection ovarian tumor.  2. Peripheral edema.  3. Acute blood loss anemia.  4. Deep venous thrombosis.   HISTORY OF PRESENT ILLNESS:  Jennifer Watkins is a 65 year old female with  history of OA, anemia secondary to menorrhagia with diagnosis of pelvic  mass prior to admission to [facility] on December 20, 2007, with  shortness of breath and abdominal pain.  Chest x-rays done revealed  pleural effusions with bilateral pneumonia.  The patient was started on  antibiotics and bilateral thoracocentesis done December 21, 2007.  She  was intubated same day secondary to worsening of hypoxia and transferred  to [facility] for further treatment.  The patient underwent  exploratory laparotomy, TAH, and bilateral salpingo-oophorectomy,  peritoneal and omental biopsy on December 24, 2007, by Dr. Audre Eaton.  She  was noted to develop left lower extremity DVT treated with subcu  heparin.  She was extubated without difficulty on December 27, 2007,  continues to have hypoxia requiring O2 per nasal cannula.  Currently,  she is noted to have issues with deconditioning, and rehab was consulted  for further therapies.  Of note, followup Doppler done on December 31, 2007, shows left tibialis posterior tip clot that is unchanged.  GYN  recommends continuing subcu heparin.  We will follow up with them in the  future.   PAST MEDICAL HISTORY:  Significant for right total hip replacement in  October 2008, history of left hip OA, and anemia.   ALLERGIES:  Vioxx.   FAMILY HISTORY:  Positive for cancer,  breast cancer, lymphoma, and  diabetes.   SOCIAL HISTORY:  The patient is single, lives alone in a 2-level home  with 18 steps at entry, works as a Conservation officer, nature .  Does not use any tobacco.   HOSPITAL COURSE:  Jennifer Watkins was admitted to rehab on January 02, 2008, for inpatient therapies to consist of PT/OT daily.  Past  admission, she was initially maintained on O2.  O2 was deferred as the  patient's endurance and mobility improved.  Followup chest x-ray of  January 02, 2008 revealed bilateral moderate pleural effusions with  bibasilar consolidation.  Labs done past admission had shown no evidence  of leukocytosis.  Vitals monitored on b.i.d. basis have shown no fevers,  and the patient was monitored along.  At the time of discharge, the  patient's blood pressure is ranging from 130s-140s systolics, 70s to 90s  diastolic.  O2 sats on room air ranged from 90-96%.  The patient's Foley  was discontinued past admission, and she was noted to be voiding without  difficulty.  Check of lytes past admission showed evidence of  hypokalemia with potassium at 2.5, and this  was supplemented  aggressively.  Last check of CBC of January 14, 2008 reveals hemoglobin  13.1, hematocrit 39.2, white count 5.9, and platelets 360.  Check of  lytes revealed sodium 139, potassium 4.2, chloride 100, CO2 28, BUN 11,  creatinine 0.5, and glucose 99.  The patient was changed from subcu  heparin to subcu Lovenox on January 10, 2008.  She continues on Lovenox  40 mg subcu per day and is to remain on this until I pushed a followup  with Dr. Ryley Eaton.  The patient has been hesitant regarding learning and  using use of subcu Lovenox, but it has been reiterated to her regarding  need for this especially with a history of DVT and recent surgery.  She  will discuss further need for anticoagulation with Dr. Janai Eaton.  The  patient's mood has been stable.  Dr. Leonides Cave, neuropsych, was consulted  for input.  He felt the  patient was coping reasonably well with her  medical situation and demands of rehab program.   During her stay in rehab, the patient has progressed along well.  Her  abdominal incision has healed well without any signs or symptoms of  infection.  Endurance level has greatly improved.  She is noted to have  some peripheral edema with increase in mobility.  She is encouraged to  keep her legs elevated and to wear support stockings; however, the  patient has declined the use of this.  Overall,the patient has reached  modified independent level for bathing and dressing.  She has had  increase in exercise tolerance for self-care as well as improve in  dynamic and standing balance.  The patient was able to ambulate in  modified independent level for 300 feet with cane, 50 feet without  assisted device with supervision.  The patient was able to navigate 19  stairs at 1 rail at modified independent supervision levels for safety.  She has modified independence for car transfers.  She is to continue  receiving further followup home health PT by [facility] past  discharge.  In advance, a rolling walker was ordered for safety.  On  January 15, 2008 the patient is discharged to home.   DISCHARGE MEDICATIONS:  1. Multivitamin one p.o. per day.  2. Lovenox 40 mg subcu daily.  3. Tylenol 325 mg 1-2 q.4-6 h. p.r.n. pain.   DIET:  Low salt.   WOUND CARE:  Hip area clean and dry.   ACTIVITY LEVELS:  No strenuous activity.  No alcohol, no smoking, no  driving.   FOLLOWUP:  The patient to follow up with Dr. Shalita Eaton for a postop check  in the next couple of weeks.  Follow up with Dr. Benedetto Goad for a  routine check in 2-3 weeks.  Follow up with Dr. Riley Kill as needed.      Greg Cutter, P.A.      Ranelle Oyster, M.D.  Electronically Signed    PP/MEDQ  D:  02/26/2008  T:  02/27/2008  Job:  [id number]   cc:   Gloriajean Dell. Andrey Campanile, M.D.  Maisie Fus

## 2011-08-19 LAB — CBC
HCT: 25 — ABNORMAL LOW
Hemoglobin: 11.5 — ABNORMAL LOW
MCHC: 32.2
MCHC: 32.7
MCHC: 33.5
MCHC: 33.4
MCV: 79.1
MCV: 84.5
Platelets: 731 — ABNORMAL HIGH
Platelets: 360
RBC: 4.05
RBC: 4.61
RDW: 20 — ABNORMAL HIGH
WBC: 28.9 — ABNORMAL HIGH

## 2011-08-19 LAB — BLOOD GAS, ARTERIAL
Acid-base deficit: 4.8 — ABNORMAL HIGH
Bicarbonate: 19.6 — ABNORMAL LOW
FIO2: 0.8
MECHVT: 500
MECHVT: 550
O2 Saturation: 99.6
PEEP: 5
Patient temperature: 98.6
RATE: 12
RATE: 18
TCO2: 20.9
pCO2 arterial: 41.1
pCO2 arterial: 55.8 — ABNORMAL HIGH
pH, Arterial: 7.203 — ABNORMAL LOW
pH, Arterial: 7.301 — ABNORMAL LOW
pO2, Arterial: 182 — ABNORMAL HIGH
pO2, Arterial: 259 — ABNORMAL HIGH

## 2011-08-19 LAB — COMPREHENSIVE METABOLIC PANEL
ALT: 12
ALT: 9
AST: 20
Albumin: 1.9 — ABNORMAL LOW
BUN: 24 — ABNORMAL HIGH
CO2: 21
CO2: 22
CO2: 33 — ABNORMAL HIGH
Calcium: 8.3 — ABNORMAL LOW
Calcium: 8.3 — ABNORMAL LOW
Calcium: 7.4 — ABNORMAL LOW
Chloride: 97
Creatinine, Ser: 1.1
Creatinine, Ser: 1.1
Creatinine, Ser: 0.55
GFR calc Af Amer: >60
GFR calc non Af Amer: 51 — ABNORMAL LOW
GFR calc non Af Amer: >60
Glucose, Bld: 139 — ABNORMAL HIGH
Glucose, Bld: 145 — ABNORMAL HIGH
Glucose, Bld: 91
Potassium: 4.7
Sodium: 130 — ABNORMAL LOW
Sodium: 133 — ABNORMAL LOW
Total Bilirubin: 1.1
Total Protein: 5.9 — ABNORMAL LOW
Total Protein: 6.1

## 2011-08-19 LAB — PROTEIN, BODY FLUID

## 2011-08-19 LAB — URINALYSIS, ROUTINE W REFLEX MICROSCOPIC
Ketones, ur: 15 — AB
Leukocytes, UA: NEGATIVE
Nitrite: NEGATIVE
Specific Gravity, Urine: 1.026
pH: 5.5

## 2011-08-19 LAB — AFP TUMOR MARKER: AFP-Tumor Marker: <1.3

## 2011-08-19 LAB — TYPE AND SCREEN: ABO/RH(D): O NEG

## 2011-08-19 LAB — RETICULOCYTES
RBC.: 3.24 — ABNORMAL LOW
Retic Ct Pct: 0.4

## 2011-08-19 LAB — BASIC METABOLIC PANEL
BUN: 28 — ABNORMAL HIGH
BUN: 6
BUN: 9
BUN: 11
CO2: 22
CO2: 28
Calcium: 7.9 — ABNORMAL LOW
Calcium: 9.8
Chloride: 102
Chloride: 101
Creatinine, Ser: 1.01
Creatinine, Ser: 0.61
Creatinine, Ser: 0.54
GFR calc Af Amer: >60
GFR calc non Af Amer: >60
GFR calc non Af Amer: >60
Glucose, Bld: 117 — ABNORMAL HIGH
Glucose, Bld: 111 — ABNORMAL HIGH
Potassium: 4.7

## 2011-08-19 LAB — DIFFERENTIAL
Basophils Relative: 1
Eosinophils Absolute: 0
Lymphocytes Relative: 13
Lymphs Abs: 0.9
Monocytes Relative: 3
Neutrophils Relative %: 94 — ABNORMAL HIGH
Neutrophils Relative %: 76
Smear Review: INCREASED

## 2011-08-19 LAB — BODY FLUID CELL COUNT WITH DIFFERENTIAL: Total Nucleated Cell Count, Fluid: 3550 — ABNORMAL HIGH

## 2011-08-19 LAB — VITAMIN B12: Vitamin B-12: 355 (ref 211–911)

## 2011-08-19 LAB — LIPASE, BLOOD: Lipase: 14

## 2011-08-19 LAB — LACTATE DEHYDROGENASE, PLEURAL OR PERITONEAL FLUID

## 2011-08-19 LAB — IRON AND TIBC

## 2011-08-19 LAB — LACTATE DEHYDROGENASE: LDH: 543 — ABNORMAL HIGH

## 2011-08-19 LAB — CULTURE, BLOOD (ROUTINE X 2): Culture: NO GROWTH

## 2011-08-19 LAB — URINE MICROSCOPIC-ADD ON

## 2011-08-19 LAB — PATHOLOGIST SMEAR REVIEW

## 2011-08-19 LAB — FOLATE: Folate: 4.7

## 2011-08-19 LAB — ALBUMIN, FLUID (OTHER): Albumin, Fluid: 1.3

## 2011-08-19 LAB — PREPARE RBC (CROSSMATCH)

## 2011-08-19 LAB — URINE CULTURE

## 2011-08-19 LAB — POTASSIUM: Potassium: 3.4 — ABNORMAL LOW

## 2011-08-29 LAB — INHIBIN A: Inhibin-A: <1

## 2011-08-29 LAB — CA 125: CA 125: 6.1

## 2011-08-30 LAB — CBC
Hemoglobin: 15.2 — ABNORMAL HIGH
MCHC: 34.7
Platelets: 178
RDW: 13.3

## 2011-08-30 LAB — BASIC METABOLIC PANEL
BUN: 10
Calcium: 10
Creatinine, Ser: 0.59
GFR calc non Af Amer: >60
Potassium: 4

## 2011-08-30 LAB — LIPID PANEL
Cholesterol: 283 — ABNORMAL HIGH
LDL Cholesterol: 200 — ABNORMAL HIGH

## 2011-08-30 LAB — CA 125: CA 125: 6.6

## 2011-08-30 LAB — INHIBIN A: Inhibin-A: <1

## 2011-09-07 LAB — CBC
HCT: 29.8 — ABNORMAL LOW
HCT: 30.1 — ABNORMAL LOW
Hemoglobin: 10 — ABNORMAL LOW
Hemoglobin: 9.9 — ABNORMAL LOW
MCHC: 33.1
MCHC: 33.3
Platelets: 158
RBC: 3.93
RDW: 24 — ABNORMAL HIGH
RDW: 25.6 — ABNORMAL HIGH

## 2011-09-07 LAB — BASIC METABOLIC PANEL
BUN: 4 — ABNORMAL LOW
CO2: 24
CO2: 27
Calcium: 7.9 — ABNORMAL LOW
Chloride: 100
GFR calc Af Amer: >60
GFR calc non Af Amer: >60
Glucose, Bld: 131 — ABNORMAL HIGH
Glucose, Bld: 131 — ABNORMAL HIGH
Potassium: 3.8
Potassium: 3.8
Sodium: 132 — ABNORMAL LOW

## 2011-09-08 LAB — CROSSMATCH
ABO/RH(D): O NEG
Antibody Screen: NEGATIVE

## 2011-09-08 LAB — CBC
MCHC: 32.1
MCV: 68.5 — ABNORMAL LOW
Platelets: 239
RBC: 4.56
RDW: 20.8 — ABNORMAL HIGH

## 2011-09-08 LAB — HEMOGLOBIN AND HEMATOCRIT, BLOOD
HCT: 36.7
Hemoglobin: 12.4

## 2011-09-08 LAB — URINALYSIS, ROUTINE W REFLEX MICROSCOPIC
Bilirubin Urine: NEGATIVE
Ketones, ur: NEGATIVE
Nitrite: NEGATIVE
Specific Gravity, Urine: 1.022
pH: 7.5

## 2011-09-08 LAB — PREGNANCY, URINE: Preg Test, Ur: NEGATIVE

## 2011-09-08 LAB — TYPE AND SCREEN

## 2011-09-08 LAB — URINE MICROSCOPIC-ADD ON

## 2021-01-27 ENCOUNTER — Inpatient Hospital Stay (HOSPITAL_COMMUNITY)
Admission: EM | Admit: 2021-01-27 | Discharge: 2021-01-29 | DRG: 280 | Disposition: A | Discharge status: Home / Self Care | Payer: Medicare Other | Attending: Cardiology | Admitting: Cardiology

## 2021-01-27 ENCOUNTER — Encounter (HOSPITAL_COMMUNITY): Payer: Self-pay | Admitting: Emergency Medicine

## 2021-01-27 ENCOUNTER — Emergency Department (HOSPITAL_COMMUNITY): Payer: Medicare Other

## 2021-01-27 ENCOUNTER — Other Ambulatory Visit: Payer: Self-pay

## 2021-01-27 DIAGNOSIS — I219 Acute myocardial infarction, unspecified: Secondary | ICD-10-CM | POA: Diagnosis not present

## 2021-01-27 DIAGNOSIS — R140 Abdominal distension (gaseous): Secondary | ICD-10-CM

## 2021-01-27 DIAGNOSIS — I110 Hypertensive heart disease with heart failure: Secondary | ICD-10-CM | POA: Diagnosis present

## 2021-01-27 DIAGNOSIS — Z8543 Personal history of malignant neoplasm of ovary: Secondary | ICD-10-CM

## 2021-01-27 DIAGNOSIS — R590 Localized enlarged lymph nodes: Secondary | ICD-10-CM | POA: Diagnosis present

## 2021-01-27 DIAGNOSIS — I214 Non-ST elevation (NSTEMI) myocardial infarction: Secondary | ICD-10-CM

## 2021-01-27 DIAGNOSIS — I249 Acute ischemic heart disease, unspecified: Secondary | ICD-10-CM | POA: Diagnosis not present

## 2021-01-27 DIAGNOSIS — I2510 Atherosclerotic heart disease of native coronary artery without angina pectoris: Secondary | ICD-10-CM | POA: Diagnosis present

## 2021-01-27 DIAGNOSIS — Z90710 Acquired absence of both cervix and uterus: Secondary | ICD-10-CM

## 2021-01-27 DIAGNOSIS — E782 Mixed hyperlipidemia: Secondary | ICD-10-CM | POA: Diagnosis present

## 2021-01-27 DIAGNOSIS — Z96641 Presence of right artificial hip joint: Secondary | ICD-10-CM | POA: Diagnosis present

## 2021-01-27 DIAGNOSIS — I5021 Acute systolic (congestive) heart failure: Secondary | ICD-10-CM | POA: Diagnosis present

## 2021-01-27 DIAGNOSIS — Z20822 Contact with and (suspected) exposure to covid-19: Secondary | ICD-10-CM | POA: Diagnosis present

## 2021-01-27 DIAGNOSIS — E876 Hypokalemia: Secondary | ICD-10-CM | POA: Diagnosis present

## 2021-01-27 DIAGNOSIS — Z90721 Acquired absence of ovaries, unilateral: Secondary | ICD-10-CM

## 2021-01-27 HISTORY — DX: Malignant neoplasm of unspecified ovary: C56.9

## 2021-01-27 HISTORY — DX: Malignant (primary) neoplasm, unspecified: C80.1

## 2021-01-27 LAB — BASIC METABOLIC PANEL WITH GFR
Anion gap: 12 (ref 5–15)
BUN: 16 mg/dL (ref 8–23)
CO2: 22 mmol/L (ref 22–32)
Calcium: 9.6 mg/dL (ref 8.9–10.3)
Chloride: 103 mmol/L (ref 98–111)
Creatinine, Ser: 0.64 mg/dL (ref 0.44–1.00)
GFR, Estimated: >60 mL/min
Glucose, Bld: 127 mg/dL — ABNORMAL HIGH (ref 70–99)
Potassium: 3.8 mmol/L (ref 3.5–5.1)
Sodium: 137 mmol/L (ref 135–145)

## 2021-01-27 LAB — CBC
HCT: 35.4 % — ABNORMAL LOW (ref 36.0–46.0)
Hemoglobin: 11.7 g/dL — ABNORMAL LOW (ref 12.0–15.0)
MCH: 28.5 pg (ref 26.0–34.0)
MCHC: 33.1 g/dL (ref 30.0–36.0)
MCV: 86.1 fL (ref 80.0–100.0)
Platelets: 211 K/uL (ref 150–400)
RBC: 4.11 MIL/uL (ref 3.87–5.11)
RDW: 13.8 % (ref 11.5–15.5)
WBC: 7.6 K/uL (ref 4.0–10.5)
nRBC: 0 % (ref 0.0–0.2)

## 2021-01-27 LAB — TROPONIN I (HIGH SENSITIVITY)
Troponin I (High Sensitivity): 12203 ng/L (ref <18)
Troponin I (High Sensitivity): 17170 ng/L (ref <18)

## 2021-01-27 MED ORDER — HEPARIN (PORCINE) 25000 UT/250ML-% IV SOLN
1000.0000 [IU]/h | INTRAVENOUS | Status: DC
  Administered 2021-01-27: 850 [IU]/h via INTRAVENOUS
  Filled 2021-01-27 (×2): qty 250

## 2021-01-27 MED ORDER — HEPARIN BOLUS VIA INFUSION
4000.0000 [IU] | Freq: Once | INTRAVENOUS | Status: AC
  Administered 2021-01-27: 4000 [IU] via INTRAVENOUS
  Filled 2021-01-27: qty 4000

## 2021-01-27 MED ORDER — ASPIRIN 81 MG PO CHEW
324.0000 mg | CHEWABLE_TABLET | Freq: Once | ORAL | Status: AC
  Administered 2021-01-27: 324 mg via ORAL
  Filled 2021-01-27: qty 4

## 2021-01-27 NOTE — ED Triage Notes (Addendum)
Pt c/o sudden onset chest pain, that radiates to her left arm and back, as well as nausea. Pain started this morning at 0130, and has been constant, but has decreased in intensity. Denies shortness of breath.

## 2021-01-27 NOTE — Progress Notes (Signed)
ANTICOAGULATION CONSULT NOTE - Initial Consult  Pharmacy Consult for heparin Indication: chest pain/ACS  No Known Allergies  Patient Measurements: Height: 5\' 4"  (162.6 cm) Weight: 72.7 kg (160 lb 4.4 oz) IBW/kg (Calculated) : 54.7 Heparin Dosing Weight: 69.7 kg   Vital Signs: Temp: 98.4 F (36.9 C) (03/02 1820) Temp Source: Oral (03/02 1820) BP: 177/110 (03/02 2115) Pulse Rate: 94 (03/02 2115)  Labs: Recent Labs    01/27/21 1821  HGB 11.7*  HCT 35.4*  PLT 211  CREATININE 0.64  TROPONINIHS 12,203*    Estimated Creatinine Clearance: 62.1 mL/min (by C-G formula based on SCr of 0.64 mg/dL).   Medical History: Past Medical History:  Diagnosis Date  . Cancer ([place])    ovarian    Medications:  (Not in a hospital admission)   Assessment: 19 YOF who presents with chest pain found to have elevated troponin. Pharmacy consulted to start IV heparin for ACS. H/H low, Plt wnl. SCr wnl   Goal of Therapy:  Heparin level 0.3-0.7 units/ml Monitor platelets by anticoagulation protocol: Yes   Plan:  -Heparin 4000 units IV bolus followed by heparin 850 units/hr -F/u 8 hr HL -Monitor daily HL, CBC and s/s of bleeding   Albertina Parr, PharmD., BCPS, BCCCP Clinical Pharmacist Please refer to [facility] for unit-specific pharmacist

## 2021-01-27 NOTE — ED Provider Notes (Signed)
[facility] EMERGENCY DEPARTMENT Provider Note   CSN: [id number] Arrival date & time: 01/27/21  1808     History Chief Complaint  Patient presents with  . Chest Pain    Jennifer Watkins is a 75 y.o. female.  HPI Patient presents after an episode of chest pain that has resolved. Patient notes that she was in her usual state of health, about 16 hours prior to ED arrival.  Without clear precipitant she developed sternal chest discomfort, pressure-like sensation, and wondered whether she was having a myocardial infarction. Symptoms improved without intervention after about 4 hours.  Since that time she has felt generally well.  However, with concern for episode of chest pain she spoke first with a friend, then with a relative.  When she was encouraged to present for evaluation given the severity of her illness. She denies history of cardiac disease.  She is active, swimming regularly, notes concerned that perhaps she overexerted herself yesterday.    Past Medical History:  Diagnosis Date  . Cancer ([place])    ovarian    There are no problems to display for this patient.   Past Surgical History:  Procedure Laterality Date  . TOTAL HIP ARTHROPLASTY       OB History   No obstetric history on file.     No family history on file.     Home Medications Prior to Admission medications   Not on File    Allergies    Patient has no known allergies.  Review of Systems   Review of Systems  Constitutional:       Per HPI, otherwise negative  HENT:       Per HPI, otherwise negative  Respiratory:       Per HPI, otherwise negative  Cardiovascular:       Per HPI, otherwise negative  Gastrointestinal: Negative for vomiting.  Endocrine:       Negative aside from HPI  Genitourinary:       Neg aside from HPI   Musculoskeletal:       Per HPI, otherwise negative  Skin: Negative.   Neurological: Negative for syncope.    Physical Exam Updated Vital Signs BP  (!) 177/110   Pulse 94   Temp 98.4 F (36.9 C) (Oral)   Resp 18   Ht 5\' 4"  (1.626 m)   Wt 72.7 kg   SpO2 91%   BMI 27.51 kg/m   Physical Exam Vitals and nursing note reviewed.  Constitutional:      General: She is not in acute distress.    Appearance: She is well-developed and well-nourished.  HENT:     Head: Normocephalic and atraumatic.  Eyes:     Extraocular Movements: EOM normal.     Conjunctiva/sclera: Conjunctivae normal.  Cardiovascular:     Rate and Rhythm: Normal rate and regular rhythm.  Pulmonary:     Effort: Pulmonary effort is normal. No respiratory distress.     Breath sounds: Normal breath sounds. No stridor.  Abdominal:     General: There is no distension.  Musculoskeletal:        General: No edema.  Skin:    General: Skin is warm and dry.  Neurological:     Mental Status: She is alert and oriented to person, place, and time.     Cranial Nerves: No cranial nerve deficit.  Psychiatric:        Mood and Affect: Mood and affect normal.      ED Results /  Procedures / Treatments   Labs (all labs ordered are listed, but only abnormal results are displayed) Labs Reviewed  BASIC METABOLIC PANEL - Abnormal; Notable for the following components:      Result Value   Glucose, Bld 127 (*)    All other components within normal limits  CBC - Abnormal; Notable for the following components:   Hemoglobin 11.7 (*)    HCT 35.4 (*)    All other components within normal limits  TROPONIN I (HIGH SENSITIVITY) - Abnormal; Notable for the following components:   Troponin I (High Sensitivity) 12,203 (*)    All other components within normal limits  SARS CORONAVIRUS 2 (TAT 6-24 HRS)  TROPONIN I (HIGH SENSITIVITY)    EKG EKG Interpretation  Date/Time:  Wednesday January 27 2021 18:19:27 EST Ventricular Rate:  97 PR Interval:  140 QRS Duration: 88 QT Interval:  366 QTC Calculation: 464 R Axis:   116 Text Interpretation: Normal sinus rhythm Left posterior  fascicular block Anteroseptal infarct , age undetermined T wave abnormality Abnormal ECG Confirmed by Carmin Muskrat [phone]) on 01/27/2021 8:21:10 PM   Radiology DG Chest 2 View  Result Date: 01/27/2021 CLINICAL DATA:  Chest pain, radiating into left shoulder and bilateral sides. Weakness. EXAM: CHEST - 2 VIEW COMPARISON:  Chest x-ray 01/07/2008 FINDINGS: The heart size and mediastinal contours are within normal limits. No pulmonary edema. Trace volume right pleural effusion. Possible trace left pleural effusion. Compressive changes of bilateral lower lobes. No pneumothorax. No acute osseous abnormality. Multilevel degenerative changes spine. IMPRESSION: Trace bilateral pleural effusions, right greater than left, with bilateral lobe compressive changes. Superimposed infection/inflammation not excluded. Followup PA and lateral chest X-ray is recommended in 3-4 weeks following therapy to ensure resolution and exclude underlying malignancy. Electronically Signed   By: Iven Finn M.D.   On: 01/27/2021 19:03    Procedures Procedures   Medications Ordered in ED Medications - No data to display  ED Course  I have reviewed the triage vital signs and the nursing notes.  Pertinent labs & imaging results that were available during my care of the patient were reviewed by me and considered in my medical decision making (see chart for details).   Cardiac monitor rate 90s, sinus, unremarkable Pulse oximetry 98% room air normal  Elderly female presents after an episode of chest pain that lasted several hours but resolved prior to my evaluation. Evaluation notable for troponin greater than 12,000.  First EKG notable for T wave abnormalities, similar to rhythm strip changes, but no ST wave changes.  Given the patient's description chest pain substantially elevated troponin, she required heparin, aspirin.  10:42 PM Second troponin 17,000.  I discussed her case with her cardiology colleagues, patient  continues to deny any pain since earlier in the day.  She requests green tea in the morning.   MDM Rules/Calculators/A&P MDM Number of Diagnoses or Management Options NSTEMI (non-ST elevated myocardial infarction) [facility]): new, needed workup   Amount and/or Complexity of Data Reviewed Clinical lab tests: reviewed Tests in the radiology section of CPT: reviewed Tests in the medicine section of CPT: reviewed Decide to obtain previous medical records or to obtain history from someone other than the patient: yes Obtain history from someone other than the patient: yes Review and summarize past medical records: yes Discuss the patient with other providers: yes Independent visualization of images, tracings, or specimens: yes  Risk of Complications, Morbidity, and/or Mortality Presenting problems: high Diagnostic procedures: high Management options: high  Critical Care  Total time providing critical care: 30-74 minutes (35)  Patient Progress Patient progress: stable  Final Clinical Impression(s) / ED Diagnoses Final diagnoses:  NSTEMI (non-ST elevated myocardial infarction) ([place])     Carmin Muskrat, MD 01/27/21 2243

## 2021-01-27 NOTE — H&P (Signed)
Jennifer Watkins is an 75 y.o. female.   Chief Complaint: Chest pain HPI:   75 y.o. 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 Patient was home last night, when she woke up from sleep around 1 AM on 01/29/2020 with chest pain, radiating to left arm and shoulder. Pain persisted for 3-4 hours. Patient decided to ride it out, as she was unsure what caused the pain. After talking to her friend Barnetta Chapel and another friend, she eventually came to the ED on 01/28/2021 evening. She has been chest pain free since 01/28/2021 7 AM. She denies any over dyspnea, also denies orthopnea,m PND, leg edema, palpitations.   Workup in the ED showed age indeterminate anterior infarct, non-diagnostic ST changes in inferior leads. Trop HS 12000-->17000. CXR showed b/l trace pleural effusions.   Past Medical History:  Diagnosis Date  . 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 hysterectromy    . right oophorecomy    . TOTAL HIP ARTHROPLASTY       Family History  Problem Relation Age of Onset  . Heart Problems Mother   . Heart Problems Father     Social History:  has no history on file for tobacco use, alcohol use, and drug use.  Allergies: No Known Allergies  Review of Systems  Constitutional: Negative for decreased appetite, malaise/fatigue, weight gain and weight loss.  HENT: Negative for congestion.   Eyes: Negative for visual disturbance.  Cardiovascular: Positive for chest pain (No resolved). Negative for dyspnea on exertion, leg swelling, palpitations and syncope.  Respiratory: Negative for cough.   Endocrine: Negative for cold intolerance.  Hematologic/Lymphatic: Does not bruise/bleed easily.   Skin: Negative for itching and rash.  Musculoskeletal: Negative for myalgias.  Gastrointestinal: Negative for abdominal pain, nausea and vomiting.  Genitourinary: Negative for dysuria.  Neurological: Negative for dizziness and weakness.  Psychiatric/Behavioral: The patient is not nervous/anxious.   All other systems reviewed and are negative.    Blood pressure (!) 174/108, pulse 84, temperature 98.4 F (36.9 C), temperature source Oral, resp. rate 19, height 5\' 4"  (1.626 m), weight 72.7 kg, SpO2 (!) 86 %. Body mass index is 27.51 kg/m.  Physical Exam Vitals and nursing note reviewed.  Constitutional:      General: She is not in acute distress.    Appearance: She is well-developed.  HENT:     Head: Normocephalic and atraumatic.  Eyes:     Conjunctiva/sclera: Conjunctivae normal.     Pupils: Pupils are equal, round, and reactive to light.  Neck:     Vascular: No JVD.  Cardiovascular:     Rate and Rhythm: Normal rate and regular rhythm.     Pulses: Normal pulses and intact distal pulses.     Heart sounds: No murmur heard.   Pulmonary:     Effort: Pulmonary effort is normal.     Breath sounds: Normal breath sounds. No wheezing or rales.  Abdominal:     General: Bowel sounds are normal. There is distension (Nontender firm lump in LUQ).     Palpations: Abdomen is soft.     Tenderness: There is no rebound.     Comments: Laparotomy scar  Musculoskeletal:  General: No tenderness. Normal range of motion.     Right lower leg: No edema.     Left lower leg: No edema.  Lymphadenopathy:     Cervical: No cervical adenopathy.  Skin:    General: Skin is warm and dry.  Neurological:     Mental Status: She is alert and oriented to person, place, and time.     Cranial Nerves: No cranial nerve deficit.     Results for orders placed or performed during the hospital encounter of 01/27/21 (from the past 48 hour(s))  Basic metabolic panel     Status: Abnormal   Collection Time:  01/27/21  6:21 PM  Result Value Ref Range   Sodium 137 135 - 145 mmol/L   Potassium 3.8 3.5 - 5.1 mmol/L   Chloride 103 98 - 111 mmol/L   CO2 22 22 - 32 mmol/L   Glucose, Bld 127 (H) 70 - 99 mg/dL    Comment: Glucose reference range applies only to samples taken after fasting for at least 8 hours.   BUN 16 8 - 23 mg/dL   Creatinine, Ser 0.64 0.44 - 1.00 mg/dL   Calcium 9.6 8.9 - 10.3 mg/dL   GFR, Estimated >60 >60 mL/min    Comment: (NOTE) Calculated using the CKD-EPI Creatinine Equation (2021)    Anion gap 12 5 - 15    Comment: Performed at [facility] [address], [place], [place] [address]  CBC     Status: Abnormal   Collection Time: 01/27/21  6:21 PM  Result Value Ref Range   WBC 7.6 4.0 - 10.5 K/uL   RBC 4.11 3.87 - 5.11 MIL/uL   Hemoglobin 11.7 (L) 12.0 - 15.0 g/dL   HCT 35.4 (L) 36.0 - 46.0 %   MCV 86.1 80.0 - 100.0 fL   MCH 28.5 26.0 - 34.0 pg   MCHC 33.1 30.0 - 36.0 g/dL   RDW 13.8 11.5 - 15.5 %   Platelets 211 150 - 400 K/uL   nRBC 0.0 0.0 - 0.2 %    Comment: Performed at [facility], [place] [address] [place], [place] [address]  Troponin I (High Sensitivity)     Status: Abnormal   Collection Time: 01/27/21  6:21 PM  Result Value Ref Range   Troponin I (High Sensitivity) 12,203 (HH) <18 ng/L    Comment: CRITICAL RESULT CALLED TO, READ BACK BY AND VERIFIED WITH: Armandina Stammer RN 2009 [id number] K FORSYTH (NOTE) Elevated high sensitivity troponin I (hsTnI) values and significant  changes across serial measurements may suggest ACS but many other  chronic and acute conditions are known to elevate hsTnI results.  Refer to the Links section for chest pain algorithms and additional  guidance. Performed at [facility], [place] [address], [place], [place] [address]   Troponin I (High Sensitivity)     Status: Abnormal   Collection Time: 01/27/21  8:21 PM  Result Value Ref Range   Troponin I (High Sensitivity) 17,170 (HH) <18 ng/L    Comment: CRITICAL  VALUE NOTED.  VALUE IS CONSISTENT WITH PREVIOUSLY REPORTED AND CALLED VALUE. (NOTE) Elevated high sensitivity troponin I (hsTnI) values and significant  changes across serial measurements may suggest ACS but many other  chronic and acute conditions are known to elevate hsTnI results.  Refer to the Links section for chest pain algorithms and additional  guidance. Performed at [facility], [place] [address], [place], [place] [address]  Labs:   Lab Results  Component Value Date   WBC 7.6 01/27/2021   HGB 11.7 (L) 01/27/2021   HCT 35.4 (L) 01/27/2021   MCV 86.1 01/27/2021   PLT 211 01/27/2021    Recent Labs  Lab 01/27/21 1821  NA 137  K 3.8  CL 103  CO2 22  BUN 16  CREATININE 0.64  CALCIUM 9.6  GLUCOSE 127*    Lipid Panel     Component Value Date/Time   CHOL (H) 10/13/2008 1039    283        ATP III CLASSIFICATION:  <200     mg/dL   Desirable  200-239  mg/dL   Borderline High  >=240    mg/dL   High   TRIG 124 10/13/2008 1039   HDL 58 10/13/2008 1039   CHOLHDL 4.9 10/13/2008 1039   VLDL 25 10/13/2008 1039   LDLCALC (H) 10/13/2008 1039    200        Total Cholesterol/HDL:CHD Risk Coronary Heart Disease Risk Table                     Men   Women  1/2 Average Risk   3.4   3.3    BNP (last 3 results) No results for input(s): BNP in the last 8760 hours.  HEMOGLOBIN A1C No results found for: HGBA1C, MPG  Cardiac Panel (last 3 results) No results for input(s): CKTOTAL, CKMB, RELINDX in the last 8760 hours.  Invalid input(s): TROPONINHS  No results found for: CKTOTAL, CKMB, CKMBINDEX   TSH No results for input(s): TSH in the last 8760 hours.   (Not in a hospital admission)     Current Facility-Administered Medications:  .  heparin ADULT infusion 100 units/mL (25000 units/262mL), 850 Units/hr, Intravenous, Continuous, [place], Darnell Level, RPH, Last Rate: 8.5 mL/hr at 01/27/21 2219, 850 Units/hr at 01/27/21 2219 No current outpatient  medications on file.   Today's Vitals   01/27/21 2115 01/27/21 2145 01/27/21 2200 01/27/21 2215  BP: (!) 177/110 (!) 164/100 (!) 169/98 (!) 174/108  Pulse: 94 90 91 84  Resp: 18 19 (!) 21 19  Temp:      TempSrc:      SpO2: 91% 92% 95% (!) 86%  Weight:      Height:      PainSc:       Body mass index is 27.51 kg/m.   CARDIAC STUDIES:  EKG 01/28/2021: Normal sinus rhythm Left posterior fascicular block Anteroseptal infarct , age undetermined Inferolateral T wave abnormality Abnormal ECG  Echocardiogram: Pending   Assessment/Plan  75 y.o. 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 Currently chest pain free. Continue Aspirin, heparin for now. [id number] decision after coronary angiogram. While she may have completed infarct form culprit artery, it is important to exclude any non-culprit severe stenosis and treat accordingly.  Lipitor 80 mg With her late presentation and no emergent revascularization, she is at risk of MI complications-mechanical and electrical. Monitor for now. Will get echocardiogram Start metoprolol succinate 50 mg, losartan 25 mg Check lipid panel, BNP  Pleural effusions: Possible due to heart failure.  Will need repeat CXR in 3-6 months to r/o any underlying malignancy  Abdominal distension, h/o ovarian tumor: No recent surveillance. Will obtain CT chest/abdomen/pelvis, and CA-125    Nigel Mormon, MD Pager: [phone] Office: [phone]

## 2021-01-28 ENCOUNTER — Encounter (HOSPITAL_COMMUNITY)
Admission: EM | Disposition: A | Discharge status: Home / Self Care | Payer: Self-pay | Source: Home / Self Care | Attending: Cardiology

## 2021-01-28 ENCOUNTER — Inpatient Hospital Stay (HOSPITAL_COMMUNITY): Payer: Medicare Other

## 2021-01-28 ENCOUNTER — Encounter (HOSPITAL_COMMUNITY): Payer: Self-pay | Admitting: Cardiology

## 2021-01-28 DIAGNOSIS — I249 Acute ischemic heart disease, unspecified: Secondary | ICD-10-CM | POA: Diagnosis present

## 2021-01-28 DIAGNOSIS — I219 Acute myocardial infarction, unspecified: Secondary | ICD-10-CM | POA: Diagnosis present

## 2021-01-28 DIAGNOSIS — I110 Hypertensive heart disease with heart failure: Secondary | ICD-10-CM | POA: Diagnosis present

## 2021-01-28 DIAGNOSIS — E876 Hypokalemia: Secondary | ICD-10-CM | POA: Diagnosis present

## 2021-01-28 DIAGNOSIS — I5021 Acute systolic (congestive) heart failure: Secondary | ICD-10-CM | POA: Diagnosis present

## 2021-01-28 DIAGNOSIS — R590 Localized enlarged lymph nodes: Secondary | ICD-10-CM | POA: Diagnosis present

## 2021-01-28 DIAGNOSIS — I2510 Atherosclerotic heart disease of native coronary artery without angina pectoris: Secondary | ICD-10-CM | POA: Diagnosis present

## 2021-01-28 DIAGNOSIS — Z20822 Contact with and (suspected) exposure to covid-19: Secondary | ICD-10-CM | POA: Diagnosis present

## 2021-01-28 DIAGNOSIS — I214 Non-ST elevation (NSTEMI) myocardial infarction: Secondary | ICD-10-CM | POA: Insufficient documentation

## 2021-01-28 DIAGNOSIS — Z90721 Acquired absence of ovaries, unilateral: Secondary | ICD-10-CM | POA: Diagnosis not present

## 2021-01-28 DIAGNOSIS — Z8543 Personal history of malignant neoplasm of ovary: Secondary | ICD-10-CM | POA: Diagnosis not present

## 2021-01-28 DIAGNOSIS — Z90710 Acquired absence of both cervix and uterus: Secondary | ICD-10-CM | POA: Diagnosis not present

## 2021-01-28 DIAGNOSIS — Z96641 Presence of right artificial hip joint: Secondary | ICD-10-CM | POA: Diagnosis present

## 2021-01-28 DIAGNOSIS — E782 Mixed hyperlipidemia: Secondary | ICD-10-CM | POA: Diagnosis present

## 2021-01-28 HISTORY — PX: LEFT HEART CATH AND CORONARY ANGIOGRAPHY: CATH118249

## 2021-01-28 LAB — ECHOCARDIOGRAM COMPLETE
AR max vel: 1.93 cm2
AV Area VTI: 1.82 cm2
AV Area mean vel: 1.77 cm2
AV Mean grad: 3 mmHg
AV Peak grad: 6.3 mmHg
Ao pk vel: 1.25 m/s
Area-P 1/2: 5.31 cm2
Height: 64 in
MV M vel: 5.56 m/s
MV Peak grad: 123.7 mmHg
Radius: 0.4 cm
S' Lateral: 3.6 cm
Weight: 2564.39 [oz_av]

## 2021-01-28 LAB — BASIC METABOLIC PANEL WITH GFR
Anion gap: 12 (ref 5–15)
BUN: 13 mg/dL (ref 8–23)
CO2: 20 mmol/L — ABNORMAL LOW (ref 22–32)
Calcium: 9.6 mg/dL (ref 8.9–10.3)
Chloride: 105 mmol/L (ref 98–111)
Creatinine, Ser: 0.66 mg/dL (ref 0.44–1.00)
GFR, Estimated: >60 mL/min
Glucose, Bld: 156 mg/dL — ABNORMAL HIGH (ref 70–99)
Potassium: 3.7 mmol/L (ref 3.5–5.1)
Sodium: 137 mmol/L (ref 135–145)

## 2021-01-28 LAB — LIPID PANEL
Cholesterol: 240 mg/dL — ABNORMAL HIGH (ref 0–200)
HDL: 45 mg/dL (ref >40)
LDL Cholesterol: 169 mg/dL — ABNORMAL HIGH (ref 0–99)
Total CHOL/HDL Ratio: 5.3 RATIO
Triglycerides: 128 mg/dL (ref <150)
VLDL: 26 mg/dL (ref 0–40)

## 2021-01-28 LAB — CBC WITH DIFFERENTIAL/PLATELET
Abs Immature Granulocytes: 0.03 10*3/uL (ref 0.00–0.07)
Basophils Absolute: 0 10*3/uL (ref 0.0–0.1)
Basophils Relative: 0 %
Eosinophils Absolute: 0 10*3/uL (ref 0.0–0.5)
Eosinophils Relative: 0 %
HCT: 38.7 % (ref 36.0–46.0)
Hemoglobin: 12.1 g/dL (ref 12.0–15.0)
Immature Granulocytes: 0 %
Lymphocytes Relative: 9 %
Lymphs Abs: 0.8 10*3/uL (ref 0.7–4.0)
MCH: 27.7 pg (ref 26.0–34.0)
MCHC: 31.3 g/dL (ref 30.0–36.0)
MCV: 88.6 fL (ref 80.0–100.0)
Monocytes Absolute: 0.3 10*3/uL (ref 0.1–1.0)
Monocytes Relative: 4 %
Neutro Abs: 7.3 10*3/uL (ref 1.7–7.7)
Neutrophils Relative %: 87 %
Platelets: 193 10*3/uL (ref 150–400)
RBC: 4.37 MIL/uL (ref 3.87–5.11)
RDW: 13.8 % (ref 11.5–15.5)
WBC: 8.4 10*3/uL (ref 4.0–10.5)
nRBC: 0 % (ref 0.0–0.2)

## 2021-01-28 LAB — HEPARIN LEVEL (UNFRACTIONATED): Heparin Unfractionated: <0.1 IU/mL — ABNORMAL LOW (ref 0.30–0.70)

## 2021-01-28 LAB — SARS CORONAVIRUS 2 (TAT 6-24 HRS): SARS Coronavirus 2: NEGATIVE

## 2021-01-28 LAB — GLUCOSE, CAPILLARY: Glucose-Capillary: 137 mg/dL — ABNORMAL HIGH (ref 70–99)

## 2021-01-28 LAB — BRAIN NATRIURETIC PEPTIDE: B Natriuretic Peptide: 659.7 pg/mL — ABNORMAL HIGH (ref 0.0–100.0)

## 2021-01-28 SURGERY — LEFT HEART CATH AND CORONARY ANGIOGRAPHY
Anesthesia: LOCAL

## 2021-01-28 MED ORDER — HEPARIN BOLUS VIA INFUSION
2000.0000 [IU] | Freq: Once | INTRAVENOUS | Status: AC
  Administered 2021-01-28: 2000 [IU] via INTRAVENOUS
  Filled 2021-01-28: qty 2000

## 2021-01-28 MED ORDER — IOHEXOL 350 MG/ML SOLN
INTRAVENOUS | Status: DC | PRN
  Administered 2021-01-28: 75 mL via INTRA_ARTERIAL

## 2021-01-28 MED ORDER — ATORVASTATIN CALCIUM 80 MG PO TABS
80.0000 mg | ORAL_TABLET | Freq: Every day | ORAL | Status: DC
  Administered 2021-01-28 (×2): 80 mg via ORAL
  Filled 2021-01-28: qty 1
  Filled 2021-01-28: qty 2

## 2021-01-28 MED ORDER — SACUBITRIL-VALSARTAN 24-26 MG PO TABS: 1.0000 | ORAL_TABLET | Freq: Two times a day (BID) | ORAL | 1 refills | Status: DC

## 2021-01-28 MED ORDER — NITROGLYCERIN 0.4 MG SL SUBL: 0.4000 mg | SUBLINGUAL_TABLET | SUBLINGUAL | 1 refills | Status: DC | PRN

## 2021-01-28 MED ORDER — CLOPIDOGREL BISULFATE 75 MG PO TABS
75.0000 mg | ORAL_TABLET | Freq: Every day | ORAL | Status: DC
Start: 2021-01-29 — End: 2021-01-29

## 2021-01-28 MED ORDER — SACUBITRIL-VALSARTAN 24-26 MG PO TABS
1.0000 | ORAL_TABLET | Freq: Two times a day (BID) | ORAL | Status: DC
  Administered 2021-01-29: 1 via ORAL
  Filled 2021-01-28: qty 1

## 2021-01-28 MED ORDER — ASPIRIN 81 MG PO CHEW: 324.0000 mg | CHEWABLE_TABLET | ORAL | Status: AC

## 2021-01-28 MED ORDER — HEPARIN SODIUM (PORCINE) 1000 UNIT/ML IJ SOLN
INTRAMUSCULAR | Status: DC | PRN
  Administered 2021-01-28 (×2): 2000 [IU] via INTRAVENOUS

## 2021-01-28 MED ORDER — NITROGLYCERIN 0.4 MG SL SUBL: 0.4000 mg | SUBLINGUAL_TABLET | SUBLINGUAL | Status: DC | PRN

## 2021-01-28 MED ORDER — POTASSIUM CHLORIDE CRYS ER 20 MEQ PO TBCR
40.0000 meq | EXTENDED_RELEASE_TABLET | Freq: Once | ORAL | Status: AC
  Administered 2021-01-28: 40 meq via ORAL
  Filled 2021-01-28: qty 2

## 2021-01-28 MED ORDER — ASPIRIN 81 MG PO CHEW: 81.0000 mg | CHEWABLE_TABLET | ORAL | Status: DC

## 2021-01-28 MED ORDER — ASPIRIN 81 MG PO TBEC: 81.0000 mg | DELAYED_RELEASE_TABLET | Freq: Every day | ORAL | 1 refills | Status: DC

## 2021-01-28 MED ORDER — FENTANYL CITRATE (PF) 100 MCG/2ML IJ SOLN
INTRAMUSCULAR | Status: DC | PRN
  Administered 2021-01-28: 50 ug via INTRAVENOUS

## 2021-01-28 MED ORDER — LIDOCAINE HCL (PF) 1 % IJ SOLN
INTRAMUSCULAR | Status: AC
  Filled 2021-01-28: qty 30

## 2021-01-28 MED ORDER — VERAPAMIL HCL 2.5 MG/ML IV SOLN
INTRAVENOUS | Status: DC | PRN
  Administered 2021-01-28 (×2): 10 mL via INTRA_ARTERIAL

## 2021-01-28 MED ORDER — HEPARIN SODIUM (PORCINE) 1000 UNIT/ML IJ SOLN
INTRAMUSCULAR | Status: AC
  Filled 2021-01-28: qty 1

## 2021-01-28 MED ORDER — ATORVASTATIN CALCIUM 80 MG PO TABS: 80.0000 mg | ORAL_TABLET | Freq: Every day | ORAL | 1 refills | Status: DC

## 2021-01-28 MED ORDER — HEPARIN (PORCINE) IN NACL 1000-0.9 UT/500ML-% IV SOLN
INTRAVENOUS | Status: AC
  Filled 2021-01-28: qty 1000

## 2021-01-28 MED ORDER — CLOPIDOGREL BISULFATE 75 MG PO TABS: 75.0000 mg | ORAL_TABLET | Freq: Every day | ORAL | 1 refills | Status: DC

## 2021-01-28 MED ORDER — SODIUM CHLORIDE 0.9 % IV SOLN: INTRAVENOUS | Status: DC

## 2021-01-28 MED ORDER — MIDAZOLAM HCL 2 MG/2ML IJ SOLN
INTRAMUSCULAR | Status: DC | PRN
  Administered 2021-01-28: 1 mg via INTRAVENOUS

## 2021-01-28 MED ORDER — METOPROLOL SUCCINATE ER 50 MG PO TB24: 50.0000 mg | ORAL_TABLET | Freq: Every day | ORAL | 1 refills | Status: DC

## 2021-01-28 MED ORDER — ASPIRIN 300 MG RE SUPP: 300.0000 mg | RECTAL | Status: AC

## 2021-01-28 MED ORDER — ACETAMINOPHEN 325 MG PO TABS: 650.0000 mg | ORAL_TABLET | ORAL | Status: DC | PRN

## 2021-01-28 MED ORDER — HEPARIN (PORCINE) IN NACL 1000-0.9 UT/500ML-% IV SOLN
INTRAVENOUS | Status: DC | PRN
  Administered 2021-01-28 (×2): 500 mL

## 2021-01-28 MED ORDER — CLOPIDOGREL BISULFATE 75 MG PO TABS
300.0000 mg | ORAL_TABLET | Freq: Once | ORAL | Status: AC
  Administered 2021-01-28: 300 mg via ORAL
  Filled 2021-01-28: qty 4

## 2021-01-28 MED ORDER — METOPROLOL SUCCINATE ER 50 MG PO TB24
50.0000 mg | ORAL_TABLET | Freq: Every day | ORAL | Status: DC
  Administered 2021-01-28 – 2021-01-29 (×2): 50 mg via ORAL
  Filled 2021-01-28: qty 1
  Filled 2021-01-28: qty 2

## 2021-01-28 MED ORDER — ASPIRIN EC 81 MG PO TBEC
81.0000 mg | DELAYED_RELEASE_TABLET | Freq: Every day | ORAL | Status: DC
  Administered 2021-01-28 – 2021-01-29 (×2): 81 mg via ORAL
  Filled 2021-01-28 (×2): qty 1

## 2021-01-28 MED ORDER — SODIUM CHLORIDE 0.9 % IV SOLN: INTRAVENOUS | Status: AC

## 2021-01-28 MED ORDER — FUROSEMIDE 10 MG/ML IJ SOLN
40.0000 mg | Freq: Once | INTRAMUSCULAR | Status: AC
  Administered 2021-01-28: 40 mg via INTRAVENOUS
  Filled 2021-01-28: qty 4

## 2021-01-28 MED ORDER — CLOPIDOGREL BISULFATE 75 MG PO TABS: 300.0000 mg | ORAL_TABLET | Freq: Every day | ORAL | Status: DC

## 2021-01-28 MED ORDER — VERAPAMIL HCL 2.5 MG/ML IV SOLN
INTRAVENOUS | Status: AC
  Filled 2021-01-28: qty 2

## 2021-01-28 MED ORDER — MIDAZOLAM HCL 2 MG/2ML IJ SOLN
INTRAMUSCULAR | Status: AC
  Filled 2021-01-28: qty 2

## 2021-01-28 MED ORDER — LIDOCAINE HCL (PF) 1 % IJ SOLN
INTRAMUSCULAR | Status: DC | PRN
  Administered 2021-01-28: 2 mL

## 2021-01-28 MED ORDER — FENTANYL CITRATE (PF) 100 MCG/2ML IJ SOLN
INTRAMUSCULAR | Status: AC
  Filled 2021-01-28: qty 2

## 2021-01-28 MED ORDER — LOSARTAN POTASSIUM 25 MG PO TABS
25.0000 mg | ORAL_TABLET | Freq: Every day | ORAL | Status: DC
  Administered 2021-01-28 (×2): 25 mg via ORAL
  Filled 2021-01-28 (×2): qty 1

## 2021-01-28 MED ORDER — ONDANSETRON HCL 4 MG/2ML IJ SOLN: 4.0000 mg | Freq: Four times a day (QID) | INTRAMUSCULAR | Status: DC | PRN

## 2021-01-28 MED ORDER — FUROSEMIDE 40 MG PO TABS: 40.0000 mg | ORAL_TABLET | Freq: Every day | ORAL | 1 refills | Status: DC

## 2021-01-28 SURGICAL SUPPLY — 14 items
CATH INFINITI JR4 5F (CATHETERS) ×2 IMPLANT
CATH LAUNCHER 5F EBU3.0 (CATHETERS) ×1 IMPLANT
CATH OPTITORQUE TIG 4.0 5F (CATHETERS) ×2 IMPLANT
CATHETER LAUNCHER 5F EBU3.0 (CATHETERS) ×2
DEVICE RAD COMP TR BAND LRG (VASCULAR PRODUCTS) IMPLANT
DEVICE RAD TR BAND REGULAR (VASCULAR PRODUCTS) ×2 IMPLANT
GLIDESHEATH SLEND SS 6F .021 (SHEATH) ×2 IMPLANT
GUIDEWIRE INQWIRE 1.5J.035X260 (WIRE) ×1 IMPLANT
INQWIRE 1.5J .035X260CM (WIRE) ×2
KIT HEART LEFT (KITS) ×2 IMPLANT
PACK CARDIAC CATHETERIZATION (CUSTOM PROCEDURE TRAY) ×2 IMPLANT
TRANSDUCER W/STOPCOCK (MISCELLANEOUS) ×2 IMPLANT
TUBING CIL FLEX 10 FLL-RA (TUBING) ×2 IMPLANT
WIRE HI TORQ VERSACORE-J 145CM (WIRE) ×2 IMPLANT

## 2021-01-28 NOTE — ED Notes (Signed)
Patient transported to CT 

## 2021-01-28 NOTE — Progress Notes (Signed)
  Echocardiogram 2D Echocardiogram has been performed.  Luisa Hart RDCS 01/28/2021, 10:11 AM

## 2021-01-28 NOTE — Interval H&P Note (Signed)
History and Physical Interval Note:  01/28/2021 12:35 PM  Jennifer Watkins  has presented today for surgery, with the diagnosis of NSTEMI.  The various methods of treatment have been discussed with the patient and family. After consideration of risks, benefits and other options for treatment, the patient has consented to  Procedure(s): LEFT HEART CATH AND CORONARY ANGIOGRAPHY (N/A) as a surgical intervention.  The patient's history has been reviewed, patient examined, no change in status, stable for surgery.  I have reviewed the patient's chart and labs.  Questions were answered to the patient's satisfaction.    2016 Appropriate Use Criteria for Coronary Revascularization in Patients With Acute Coronary Syndrome NSTEMI/UA High Risk (TIMI Score 5-7) NSTEMI/Unstable angina, stabilized patient at high risk Link Here: sistemancia.com Indication:  Revascularization by PCI or CABG of 1 or more arteries in a patient with NSTEMI or unstable angina with Stabilization after presentation High risk for clinical events  A (7) Indication: 16; Score 7    [place]

## 2021-01-28 NOTE — H&P (View-Only) (Signed)
Pending further cardiac work-up, I obtained chest abdomen pelvis CT scan given patient's abdominal distention and for masses.  CT scan showed bilateral abdominal masses along with significant retroperitoneal adenopathy.  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  At patient's request, I contacted Dr. Cindie Laroche, OB/GYN oncologist at [facility], who originally treated patient in 2009.  Dr. Clarene Essex was gracious to call me back.  Based on our conversation, he recommended that I contact Dr. Everitt Amber.  Awaiting callback from Dr. Denman George.  Her echocardiogram shows mild global hypokinesis with severely reduced global longitudinal strain.  I will proceed with coronary angiogram for diagnostic purposes only.  We will need multidisciplinary discussion regarding management of her potential CAD and abdominal masses and adenopathy.  Updated patient and her sister Jocelyn Lamer.  Time spent: 40 min    Nigel Mormon, MD Pager: [phone] Office: [phone]

## 2021-01-28 NOTE — Progress Notes (Addendum)
Pending further cardiac work-up, I obtained chest abdomen pelvis CT scan given patient's abdominal distention and for masses.  CT scan showed bilateral abdominal masses along with significant retroperitoneal adenopathy.  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  At patient's request, I contacted Dr. Cindie Laroche, OB/GYN oncologist at [facility], who originally treated patient in 2009.  Dr. Clarene Essex was gracious to call me back.  Based on our conversation, he recommended that I contact Dr. Everitt Amber.  Awaiting callback from Dr. Denman George.  Her echocardiogram shows mild global hypokinesis with severely reduced global longitudinal strain.  I will proceed with coronary angiogram for diagnostic purposes only.  We will need multidisciplinary discussion regarding management of her potential CAD and abdominal masses and adenopathy.  Updated patient and her sister Jocelyn Lamer.  Time spent: 40 min    Nigel Mormon, MD Pager: [phone] Office: [phone]

## 2021-01-28 NOTE — ED Notes (Signed)
Pt assisted to BSC

## 2021-01-28 NOTE — ED Notes (Signed)
Back to room at this time.

## 2021-01-28 NOTE — ED Notes (Signed)
Echo at bedside

## 2021-01-28 NOTE — Progress Notes (Signed)
ANTICOAGULATION CONSULT NOTE - Follow Up Consult  Pharmacy Consult for heparin Indication: MI  Labs: Recent Labs    01/27/21 1821 01/27/21 2021 01/28/21 0145 01/28/21 0146  HGB 11.7*  --  12.1  --   HCT 35.4*  --  38.7  --   PLT 211  --  193  --   HEPARINUNFRC  --   --   --  <0.10*  CREATININE 0.64  --  0.66  --   TROPONINIHS 12,203* 17,170*  --   --     Assessment: 75yo female subtherapeutic on heparin with initial dosing for MI though lab was drawn early; no gtt issues or signs of bleeding per RN.  Goal of Therapy:  Heparin level 0.3-0.7 units/ml   Plan:  Will give small heparin bolus of 2000 units and increase heparin gtt by 2 units/kg/hr to 1000 units/hr and check level in 8 hours.    Jennifer Watkins, PharmD, BCPS  01/28/2021,4:46 AM

## 2021-01-28 NOTE — ED Notes (Signed)
Placed on oxygen at 3lpm via [place] - pt was 89% on room air, Dr. Joya Gaskins aware.

## 2021-01-29 ENCOUNTER — Encounter (HOSPITAL_COMMUNITY): Payer: Self-pay | Admitting: Cardiology

## 2021-01-29 LAB — CBC WITH DIFFERENTIAL/PLATELET
Abs Immature Granulocytes: 0.03 10*3/uL (ref 0.00–0.07)
Basophils Absolute: 0 10*3/uL (ref 0.0–0.1)
Basophils Relative: 0 %
Eosinophils Absolute: 0 10*3/uL (ref 0.0–0.5)
Eosinophils Relative: 0 %
HCT: 32.7 % — ABNORMAL LOW (ref 36.0–46.0)
Hemoglobin: 11 g/dL — ABNORMAL LOW (ref 12.0–15.0)
Immature Granulocytes: 0 %
Lymphocytes Relative: 12 %
Lymphs Abs: 0.9 10*3/uL (ref 0.7–4.0)
MCH: 28.2 pg (ref 26.0–34.0)
MCHC: 33.6 g/dL (ref 30.0–36.0)
MCV: 83.8 fL (ref 80.0–100.0)
Monocytes Absolute: 0.7 10*3/uL (ref 0.1–1.0)
Monocytes Relative: 9 %
Neutro Abs: 5.5 10*3/uL (ref 1.7–7.7)
Neutrophils Relative %: 79 %
Platelets: 207 10*3/uL (ref 150–400)
RBC: 3.9 MIL/uL (ref 3.87–5.11)
RDW: 13.9 % (ref 11.5–15.5)
WBC: 7.1 10*3/uL (ref 4.0–10.5)
nRBC: 0 % (ref 0.0–0.2)

## 2021-01-29 LAB — BASIC METABOLIC PANEL
Anion gap: 11 (ref 5–15)
BUN: 14 mg/dL (ref 8–23)
CO2: 23 mmol/L (ref 22–32)
Calcium: 9.2 mg/dL (ref 8.9–10.3)
Chloride: 101 mmol/L (ref 98–111)
Creatinine, Ser: 0.64 mg/dL (ref 0.44–1.00)
GFR, Estimated: >60 mL/min (ref >60)
Glucose, Bld: 147 mg/dL — ABNORMAL HIGH (ref 70–99)
Potassium: 3 mmol/L — ABNORMAL LOW (ref 3.5–5.1)
Sodium: 135 mmol/L (ref 135–145)

## 2021-01-29 LAB — CA 125: Cancer Antigen (CA) 125: 12.2 U/mL (ref 0.0–38.1)

## 2021-01-29 MED ORDER — HYDRALAZINE HCL 20 MG/ML IJ SOLN: 10.0000 mg | INTRAMUSCULAR | Status: AC | PRN

## 2021-01-29 MED ORDER — SODIUM CHLORIDE 0.9% FLUSH
3.0000 mL | INTRAVENOUS | Status: DC | PRN
Start: 2021-01-29 — End: 2021-01-29

## 2021-01-29 MED ORDER — POTASSIUM CHLORIDE CRYS ER 20 MEQ PO TBCR: 40.0000 meq | EXTENDED_RELEASE_TABLET | Freq: Every day | ORAL | 1 refills | Status: DC

## 2021-01-29 MED ORDER — LABETALOL HCL 5 MG/ML IV SOLN: 10.0000 mg | INTRAVENOUS | Status: AC | PRN

## 2021-01-29 MED ORDER — SODIUM CHLORIDE 0.9 % IV SOLN: 250.0000 mL | INTRAVENOUS | Status: DC | PRN

## 2021-01-29 MED ORDER — ONDANSETRON HCL 4 MG/2ML IJ SOLN: 4.0000 mg | Freq: Four times a day (QID) | INTRAMUSCULAR | Status: DC | PRN

## 2021-01-29 MED ORDER — CLOPIDOGREL BISULFATE 75 MG PO TABS
75.0000 mg | ORAL_TABLET | Freq: Every day | ORAL | Status: DC
  Administered 2021-01-29: 75 mg via ORAL
  Filled 2021-01-29: qty 1

## 2021-01-29 MED ORDER — ACETAMINOPHEN 325 MG PO TABS: 650.0000 mg | ORAL_TABLET | ORAL | Status: DC | PRN

## 2021-01-29 MED ORDER — POTASSIUM CHLORIDE CRYS ER 20 MEQ PO TBCR
40.0000 meq | EXTENDED_RELEASE_TABLET | Freq: Every day | ORAL | Status: DC
  Administered 2021-01-29: 40 meq via ORAL
  Filled 2021-01-29: qty 2

## 2021-01-29 MED ORDER — SODIUM CHLORIDE 0.9% FLUSH: 3.0000 mL | Freq: Two times a day (BID) | INTRAVENOUS | Status: DC

## 2021-01-29 MED FILL — NITROGLYCERIN 0.4 MG TAB SL: 0.4 | 7 days supply | Qty: 25 | Fill #0

## 2021-01-29 MED FILL — ENTRESTO 24 MG-26 MG TABLET: 24-26 | 30 days supply | Qty: 60 | Fill #0

## 2021-01-29 MED FILL — CLOPIDOGREL 75 MG TABLET: 75 | 30 days supply | Qty: 30 | Fill #0

## 2021-01-29 MED FILL — METOPROLOL SUCCINATE ER 50: 50 | 30 days supply | Qty: 30 | Fill #0

## 2021-01-29 MED FILL — POTASSIUM CHLORIDE 20meqER: 20 | 30 days supply | Qty: 60 | Fill #0

## 2021-01-29 MED FILL — FUROSEMIDE 40 MG TABLET: 40 | 30 days supply | Qty: 30 | Fill #0

## 2021-01-29 MED FILL — ASPIRIN LOW DOSE 81 MG TBEC: 81 | 30 days supply | Qty: 30 | Fill #0

## 2021-01-29 MED FILL — ATORVASTATIN CALCIUM 80 MG: 80 | 30 days supply | Qty: 30 | Fill #0

## 2021-01-29 NOTE — Discharge Summary (Signed)
Physician Discharge Summary  Patient ID: Jennifer Watkins MRN: [id number] DOB/AGE: 12-08-1945 75 y.o.  Admit date: 01/27/2021 Discharge date: 01/29/2021  Primary Discharge Diagnosis: Acute coronary syndrome Heart failure with reduced ejection fraction  Secondary Discharge Diagnosis: Mixed hyperlipidemia Hypertension Retroperitoneal adenopathy Abdominal masses   Hospital Course:    75 y.o. 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  EKG showed possible old anterior septal infarct, as well as inferolateral T wave inversion.  On presentation, patient was chest pain-free.  Her troponin was elevated to 17,000.  On my physical exam I also found abdominal masses, thus ordered CT scan.  CT scan showed severe retroperitoneal adenopathy and bilateral abdominal masses.  Suspecting recurrence or presence of a new tumor, possibly malignancy, I reached out to patient's OB/GYN oncologist Dr. Clarene Watkins who treated her in 2009.  He recommended me to contact Dr. Serita Watkins office.  Dr. Serita Watkins partner Dr. Berline Watkins made an appointment to see in the office for further consultation on 02/01/2021.  Given possibility of malignancy and late presentation of her MI, I did not think she would benefit from coronary intervention.  Nonetheless, coronary angiogram was felt warranted for recertification, especially with the possibility of any surgical intervention for her abdominal masses.  Therefore, I performed coronary angiogram, that showed occluded high OM1, likely the culprit vessel.  No other severe stenoses were found. Echocardiogram showed global hypokinesis with severely reduced global longitudinal strain, EF 40%.  Patient did not have any MI complications. She was chest pain free, without any significant shortness of breath, on discharge.   I recommended medical  therapy with aspirin, Plavix, high intensity statin, as well as Entresto, Lasix and potassium for management of CAD, completed myocardial infarct, and mild heart failure with reduced ejection fraction.  Transition care visit appointment made for 02/02/2021 with me.  Discharge Exam: Blood pressure 130/86, pulse 84, temperature 98.3 F (36.8 C), temperature source Oral, resp. rate 20, height 5\' 4"  (1.626 m), weight 67.2 kg, SpO2 97 %.   Vitals and nursing note reviewed.  Constitutional:      General: She is not in acute distress.    Appearance: She is well-developed.  HENT:     Head: Normocephalic and atraumatic.  Eyes:     Conjunctiva/sclera: Conjunctivae normal.     Pupils: Pupils are equal, round, and reactive to light.  Neck:     Vascular: No JVD.  Cardiovascular:     Rate and Rhythm: Normal rate and regular rhythm.     Pulses: Normal pulses and intact distal pulses.     Heart sounds: No murmur heard.   Pulmonary:     Effort: Pulmonary effort is normal.     Breath sounds: Normal breath sounds. No wheezing or rales.  Abdominal:     General: Bowel sounds are normal. There is distension (Nontender firm lump in LUQ).     Palpations: Abdomen is soft.     Tenderness: There is no rebound.     Comments: Laparotomy scar  Musculoskeletal:        General: No tenderness. Normal range of motion.     Right lower leg: No edema.     Left lower leg: No edema.  Lymphadenopathy:     Cervical: No cervical adenopathy.  Skin:    General: Skin is warm and dry.  Neurological:     Mental Status:  She is alert and oriented to person, place, and time.     Cranial Nerves: No cranial nerve deficit.       Significant Diagnostic Studies:  CARDIAC STUDIES:  Coronary angiogram 01/28/2021: LM: Normal LAD: Mid diffuse 50% disease        Small D2 ostial 70% stenosis LCx: Medium sized high OM1 with proximal 100% thrombotic occlusion        Likely culprit vessel with completed infarct RCA: Small.  Mild luminal irregularities  Late presentation with completed infarct from occlusion of high OM1 vessel No benefit from revascularization given late presentation Moderate non-bstructive disease in LAD Normal LVEDP   EKG 01/28/2021: Normal sinus rhythm Left posterior fascicular block Anteroseptal infarct , age undetermined Inferolateral T wave abnormality Abnormal ECG  Echocardiogram 01/28/2021: 1. Left ventricular ejection fraction, by estimation, is 40 to 45%. The  left ventricle has mildly decreased function. The left ventricle  demonstrates global hypokinesis. There is mild left ventricular  hypertrophy. Left ventricular diastolic parameters  are indeterminate. The average left ventricular global longitudinal strain  is -5.5 %. The global longitudinal strain is abnormal.  2. Right ventricular systolic function is normal. The right ventricular  size is normal. There is moderately elevated pulmonary artery systolic  pressure at 49 mmHg.  3. The mitral valve is grossly normal. Mild mitral valve regurgitation.  4. The aortic valve is tricuspid. Aortic valve regurgitation is not  visualized. Mild aortic valve sclerosis is present, with no evidence of  aortic valve stenosis.   Labs:   Lab Results  Component Value Date   WBC 7.1 01/29/2021   HGB 11.0 (L) 01/29/2021   HCT 32.7 (L) 01/29/2021   MCV 83.8 01/29/2021   PLT 207 01/29/2021    Recent Labs  Lab 01/29/21 0438  NA 135  K 3.0*  CL 101  CO2 23  BUN 14  CREATININE 0.64  CALCIUM 9.2  GLUCOSE 147*    Lipid Panel     Component Value Date/Time   CHOL 240 (H) 01/27/2021 1821   TRIG 128 01/27/2021 1821   HDL 45 01/27/2021 1821   CHOLHDL 5.3 01/27/2021 1821   VLDL 26 01/27/2021 1821   LDLCALC 169 (H) 01/27/2021 1821    BNP (last 3 results) Recent Labs    01/27/21 1821  BNP 659.7*    Cardiac Panel (last 3 results) Results for Jennifer, Watkins (MRN [id number]) as of 01/29/2021 18:35  Ref. Range 01/27/2021  18:21 01/27/2021 20:21  B Natriuretic Peptide Latest Ref Range: 0.0 - 100.0 pg/mL 659.7 (H)   Troponin I (High Sensitivity) Latest Ref Range: <18 ng/L 12,203 [facility]) 17,170 [facility])    Radiology: DG Chest 2 View  Result Date: 01/27/2021 CLINICAL DATA:  Chest pain, radiating into left shoulder and bilateral sides. Weakness. EXAM: CHEST - 2 VIEW COMPARISON:  Chest x-ray 01/07/2008 FINDINGS: The heart size and mediastinal contours are within normal limits. No pulmonary edema. Trace volume right pleural effusion. Possible trace left pleural effusion. Compressive changes of bilateral lower lobes. No pneumothorax. No acute osseous abnormality. Multilevel degenerative changes spine. IMPRESSION: Trace bilateral pleural effusions, right greater than left, with bilateral lobe compressive changes. Superimposed infection/inflammation not excluded. Followup PA and lateral chest X-ray is recommended in 3-4 weeks following therapy to ensure resolution and exclude underlying malignancy. Electronically Signed   By: Iven Finn M.D.   On: 01/27/2021 19:03   CARDIAC CATHETERIZATION  Result Date: 01/28/2021 LM: Normal LAD: Mid diffuse 50% disease  Small D2 ostial 70% stenosis LCx: Medium sized high OM1 with proximal 100% thrombotic occlusion        Likely culprit vessel with completed infarct RCA: Small. Mild luminal irregularities Late presentation with completed infarct from occlusion of high OM1 vessel No benefit from revascularization given late presentation Moderate non-bstructive disease in LAD Normal LVEDP Nigel Mormon, MD Pager: [phone] Office: [phone]   ECHOCARDIOGRAM COMPLETE  Result Date: 01/28/2021    ECHOCARDIOGRAM REPORT   Patient [facility]:   [facility] Date of Exam: 01/28/2021 Medical Rec #:  [id number]       Height:       64.0 in Accession #:    [id number]      Weight:       160.3 lb Date of Birth:  [birth year]        BSA:          1.781 m Patient Age:    89 years        BP:           152/106  mmHg Patient Gender: F               HR:           96 bpm. Exam Location:  Inpatient Procedure: 2D Echo Indications:    Pre chemo  History:        Patient has no prior history of Echocardiogram examinations.  Sonographer:    [place] Referring Phys: [id number] [place]  1. Left ventricular ejection fraction, by estimation, is 40 to 45%. The left ventricle has mildly decreased function. The left ventricle demonstrates global hypokinesis. There is mild left ventricular hypertrophy. Left ventricular diastolic parameters are indeterminate. The average left ventricular global longitudinal strain is -5.5 %. The global longitudinal strain is abnormal.  2. Right ventricular systolic function is normal. The right ventricular size is normal. There is moderately elevated pulmonary artery systolic pressure at 49 mmHg.  3. The mitral valve is grossly normal. Mild mitral valve regurgitation.  4. The aortic valve is tricuspid. Aortic valve regurgitation is not visualized. Mild aortic valve sclerosis is present, with no evidence of aortic valve stenosis. FINDINGS  Left Ventricle: Left ventricular ejection fraction, by estimation, is 40 to 45%. The left ventricle has mildly decreased function. The left ventricle demonstrates global hypokinesis. The average left ventricular global longitudinal strain is -5.5 %. The  global longitudinal strain is abnormal. The left ventricular internal cavity size was normal in size. There is mild left ventricular hypertrophy. Left ventricular diastolic parameters are indeterminate. Right Ventricle: The right ventricular size is normal. No increase in right ventricular wall thickness. Right ventricular systolic function is normal. There is moderately elevated pulmonary artery systolic pressure. The tricuspid regurgitant velocity is 3.39 m/s, and with an assumed right atrial pressure of 3 mmHg, the estimated right ventricular systolic pressure is 50.2 mmHg. Left Atrium:  Left atrial size was normal in size. Right Atrium: Right atrial size was normal in size. Pericardium: There is no evidence of pericardial effusion. Mitral Valve: The mitral valve is grossly normal. Mild mitral valve regurgitation. Tricuspid Valve: The tricuspid valve is grossly normal. Tricuspid valve regurgitation is mild. Aortic Valve: The aortic valve is tricuspid. Aortic valve regurgitation is not visualized. Mild aortic valve sclerosis is present, with no evidence of aortic valve stenosis. Aortic valve mean gradient measures 3.0 mmHg. Aortic valve peak gradient measures 6.2 mmHg. Aortic valve area, by VTI measures 1.82 cm. Pulmonic Valve: The pulmonic valve  was grossly normal. Pulmonic valve regurgitation is not visualized. Aorta: The aortic root and ascending aorta are structurally normal, with no evidence of dilitation. IAS/Shunts: No atrial level shunt detected by color flow Doppler.  LEFT VENTRICLE PLAX 2D LVIDd:         4.50 cm LVIDs:         3.60 cm  2D Longitudinal Strain LV PW:         1.30 cm  2D Strain GLS Avg:     -5.5 % LV IVS:        1.30 cm LVOT diam:     1.90 cm LV SV:         40 LV SV Index:   22 LVOT Area:     2.84 cm  RIGHT VENTRICLE RV S prime:     16.10 cm/s TAPSE (M-mode): 1.5 cm RIGHT ATRIUM          Index RA Area:     5.94 cm RA Volume:   6.64 ml  3.73 ml/m  AORTIC VALVE                   PULMONIC VALVE AV Area (Vmax):    1.93 cm    PV Vmax:       0.99 m/s AV Area (Vmean):   1.77 cm    PV Vmean:      64.500 cm/s AV Area (VTI):     1.82 cm    PV VTI:        0.176 m AV Vmax:           125.00 cm/s PV Peak grad:  3.9 mmHg AV Vmean:          88.100 cm/s PV Mean grad:  2.0 mmHg AV VTI:            0.220 m AV Peak Grad:      6.2 mmHg AV Mean Grad:      3.0 mmHg LVOT Vmax:         85.00 cm/s LVOT Vmean:        55.100 cm/s LVOT VTI:          0.141 m LVOT/AV VTI ratio: 0.64  AORTA Ao Root diam: 3.30 cm Ao Asc diam:  3.20 cm MITRAL VALVE                 TRICUSPID VALVE MV Area (PHT): 5.31 cm       TR Peak grad:   46.0 mmHg MV Decel Time: 143 msec      TR Vmax:        339.00 cm/s MR Peak grad:    123.7 mmHg MR Mean grad:    83.0 mmHg   SHUNTS MR Vmax:         556.00 cm/s Systemic VTI:  0.14 m MR Vmean:        429.0 cm/s  Systemic Diam: 1.90 cm MR PISA:         1.01 cm MR PISA Eff ROA: 6 mm MR PISA Radius:  0.40 cm MV E velocity: 124.00 cm/s MV A velocity: 117.00 cm/s MV E/A ratio:  1.06 Abelina Ketron MD Electronically signed by Vernell Leep MD Signature Date/Time: 01/28/2021/11:39:37 AM    Final    CT CHEST ABDOMEN PELVIS WO CONTRAST  Result Date: 01/28/2021 CLINICAL DATA:  Ovarian cancer, status post right oophorectomy and hysterectomy, pleural effusion and abdominal distension. EXAM: CT CHEST, ABDOMEN AND PELVIS WITHOUT CONTRAST TECHNIQUE: Multidetector CT imaging of the  chest, abdomen and pelvis was performed following the standard protocol without IV contrast. COMPARISON:  None. FINDINGS: CT CHEST FINDINGS Cardiovascular: Mild coronary artery calcification. Global cardiac size within normal limits. No pericardial effusion. Central pulmonary arteries are of normal caliber. Mild atherosclerotic calcification within the thoracic aorta. No aortic aneurysm. Mediastinum/Nodes: Thyroid unremarkable. No pathologic mediastinal adenopathy. Esophagus unremarkable. Lungs/Pleura: Diffuse interlobular septal thickening, scattered areas of perihilar ground-glass pulmonary infiltrate, and small bilateral pleural effusions are present all in keeping with moderate pulmonary edema, possibly cardiogenic in nature. Bibasilar compressive atelectasis. No pneumothorax. No central obstructing lesion. Musculoskeletal: No acute bone abnormality. No lytic or blastic bone lesion. CT ABDOMEN PELVIS FINDINGS Hepatobiliary: Liver unremarkable. No intra or extrahepatic biliary ductal dilation. Cholelithiasis noted without pericholecystic inflammatory change identified. Pancreas: Unremarkable on this noncontrast examination  Spleen: Unremarkable Adrenals/Urinary Tract: The adrenal glands are not optimally visualized due to retroperitoneal adenopathy, noncontrast technique, and distortion secondary to abdominal masses, but appear unremarkable. The kidneys are normal in size. The right kidney is displaced superiorly by the right-sided abdominal mass, however, there is no hydronephrosis identified. Right renal cortical thickness is preserved. No intrarenal masses or calcifications are seen. On the left, there is mild to moderate hydronephrosis secondary to an obstructing 7 mm x 10 mm calculus within the left ureteropelvic junction. Additional 3 mm nonobstructing calculus within the left renal pelvis. The bladder is unremarkable. Stomach/Bowel: The enteric structures are displaced by the multiple abdominal masses, however, there is no evidence of obstruction or focal inflammation. Stomach, small bowel, and large bowel are otherwise unremarkable. Appendix normal. Trace free fluid within the pelvis. No free intraperitoneal gas. Vascular/Lymphatic: Moderate aortoiliac atherosclerotic calcification. No aortic aneurysm. There is pathologic retroperitoneal adenopathy within the a left common iliac, left periaortic, aortocaval, and retrocrural lymph node groups. These are difficult to discern discretely due to noncontrast technique, however, a solid, pathologically enlarged left common iliac lymph node measures 3.3 x 3.5 cm at axial image # 85/3. Reproductive: Uterus is absent. Bilateral abdominal masses are present which appear displaced from the pelvis but appear to be associated with the gonadal vessels and likely represent the ovarian structures. On the left, the predominantly solid ovarian mass measures 12.4 x 14.4 x 15.7 cm. On the right, the multiloculated cystic structure measures 11.3 x 12.4 x 16.9 cm. Other: Tiny fat containing parasagittal supraumbilical ventral hernia. Mild diffuse body wall subcutaneous edema in keeping with anasarca.  Musculoskeletal: Right total hip arthroplasty has been performed. No lytic or blastic bone lesions. IMPRESSION: Bilateral large abdominal mass is intimately associated with the gonadal vessels in keeping with bilateral ovarian neoplasm. Extensive pathologic retroperitoneal adenopathy. Trace ascites. Moderate pulmonary edema. Anasarca with small bilateral pleural effusions and mild diffuse body wall subcutaneous edema. Moderate left hydronephrosis secondary to an obstructing 7 mm x 10 mm calculus within the left ureteropelvic junction. Superimposed minimal left nonobstructing nephrolithiasis. Mild coronary artery calcification. Aortic Atherosclerosis (ICD10-I70.0). Electronically Signed   By: Fidela Salisbury MD   On: 01/28/2021 01:22      FOLLOW UP PLANS AND APPOINTMENTS Discharge Instructions    (HEART FAILURE PATIENTS) Call MD:  Anytime you have any of the following symptoms: 1) 3 pound weight gain in 24 hours or 5 pounds in 1 week 2) shortness of breath, with or without a dry hacking cough 3) swelling in the hands, feet or stomach 4) if you have to sleep on extra pillows at night in order to breathe.   Complete by: As directed    Diet -  low sodium heart healthy   Complete by: As directed    Increase activity slowly   Complete by: As directed      Allergies as of 01/29/2021   No Known Allergies     Medication List    STOP taking these medications   ibuprofen 200 MG tablet Commonly known as: ADVIL     TAKE these medications   aspirin 81 MG EC tablet Take 1 tablet (81 mg total) by mouth daily. Swallow whole.   atorvastatin 80 MG tablet Commonly known as: LIPITOR Take 1 tablet (80 mg total) by mouth daily at 6 PM.   clopidogrel 75 MG tablet Commonly known as: PLAVIX Take 1 tablet (75 mg total) by mouth daily.   furosemide 40 MG tablet Commonly known as: Lasix Take 1 tablet (40 mg total) by mouth daily.   metoprolol succinate 50 MG 24 hr tablet Commonly known as: TOPROL-XL Take  1 tablet (50 mg total) by mouth daily. Take with or immediately following a meal.   nitroGLYCERIN 0.4 MG SL tablet Commonly known as: NITROSTAT Place 1 tablet (0.4 mg total) under the tongue every 5 (five) minutes x 3 doses as needed for chest pain.   potassium chloride SA 20 MEQ tablet Commonly known as: KLOR-CON Take 2 tablets (40 mEq total) by mouth daily.   sacubitril-valsartan 24-26 MG Commonly known as: ENTRESTO Take 1 tablet by mouth 2 (two) times daily.       Follow-up Information    Nigel Mormon, MD Follow up on 02/02/2021.   Specialties: Cardiology, Radiology Why: 2:30 PM Contact information: [place] [address] [phone]        Lafonda Mosses, MD Follow up on 02/01/2021.   Specialty: Gynecologic Oncology Why: at 9:45am at the [place] attached to [facility] for consultation. Please arrive 30 minutes before your appointment to check in and get registered. There is free valet parking in the front of the [place] as well.  Contact information: [place] [address] [phone]                 Nigel Mormon, MD Pager: [phone] Office: [phone]

## 2021-01-29 NOTE — Progress Notes (Signed)
CSW met with pt in response to [facility] consult about a "patient advocate." CSW met with pt who explained her family member was wanting someone to advocate on pt's behalf to make sure she got an oncology referral. Pt explained that oncology referral is resolved. Pt had a previous oncologist Dr. Theodis Shove who is in [place] now. Pt called Dr. Theodis Shove who gave pt a contact for a [facility] oncologist. Pt states that her cardiologist is waiting on response from oncologist that Dr. Theodis Shove referred pt to. CSW explained that if pt is arranged at [place] center there should be social workers to advocate for her and that other cancer centers may have social workers as well. CSW provided pt number for [facility]. CSW also provided resource information Patient Navigation Team which serves the [place] area. Pt explains she is ready for discharge and just needs to know a time to have her friend pick her up.

## 2021-01-29 NOTE — Progress Notes (Signed)
D/C instructions given and reviewed. Questions asked and answered. IV removed, tolerated well.

## 2021-02-01 ENCOUNTER — Other Ambulatory Visit: Payer: Self-pay

## 2021-02-01 ENCOUNTER — Inpatient Hospital Stay: Payer: Medicare Other

## 2021-02-01 ENCOUNTER — Other Ambulatory Visit: Payer: Self-pay | Admitting: Hematology and Oncology

## 2021-02-01 ENCOUNTER — Encounter: Payer: Self-pay | Admitting: Oncology

## 2021-02-01 ENCOUNTER — Encounter: Payer: Self-pay | Admitting: Gynecologic Oncology

## 2021-02-01 ENCOUNTER — Ambulatory Visit: Payer: Medicare Other

## 2021-02-01 ENCOUNTER — Inpatient Hospital Stay: Payer: Medicare Other | Attending: Gynecologic Oncology | Admitting: Gynecologic Oncology

## 2021-02-01 VITALS — BP 148/88 | HR 93 | Temp 97.0°F | Resp 20 | Ht 62.72 in | Wt 148.0 lb

## 2021-02-01 DIAGNOSIS — Z90710 Acquired absence of both cervix and uterus: Secondary | ICD-10-CM | POA: Insufficient documentation

## 2021-02-01 DIAGNOSIS — I2510 Atherosclerotic heart disease of native coronary artery without angina pectoris: Secondary | ICD-10-CM | POA: Insufficient documentation

## 2021-02-01 DIAGNOSIS — R188 Other ascites: Secondary | ICD-10-CM | POA: Insufficient documentation

## 2021-02-01 DIAGNOSIS — N132 Hydronephrosis with renal and ureteral calculous obstruction: Secondary | ICD-10-CM | POA: Diagnosis not present

## 2021-02-01 DIAGNOSIS — R971 Elevated cancer antigen 125 [CA 125]: Secondary | ICD-10-CM | POA: Diagnosis not present

## 2021-02-01 DIAGNOSIS — R630 Anorexia: Secondary | ICD-10-CM | POA: Diagnosis present

## 2021-02-01 DIAGNOSIS — Z79899 Other long term (current) drug therapy: Secondary | ICD-10-CM | POA: Insufficient documentation

## 2021-02-01 DIAGNOSIS — R634 Abnormal weight loss: Secondary | ICD-10-CM | POA: Diagnosis not present

## 2021-02-01 DIAGNOSIS — R978 Other abnormal tumor markers: Secondary | ICD-10-CM | POA: Diagnosis not present

## 2021-02-01 DIAGNOSIS — C569 Malignant neoplasm of unspecified ovary: Secondary | ICD-10-CM

## 2021-02-01 DIAGNOSIS — I252 Old myocardial infarction: Secondary | ICD-10-CM

## 2021-02-01 DIAGNOSIS — Z90722 Acquired absence of ovaries, bilateral: Secondary | ICD-10-CM | POA: Insufficient documentation

## 2021-02-01 DIAGNOSIS — I214 Non-ST elevation (NSTEMI) myocardial infarction: Secondary | ICD-10-CM

## 2021-02-01 DIAGNOSIS — Z8543 Personal history of malignant neoplasm of ovary: Secondary | ICD-10-CM | POA: Diagnosis not present

## 2021-02-01 DIAGNOSIS — Z7902 Long term (current) use of antithrombotics/antiplatelets: Secondary | ICD-10-CM | POA: Insufficient documentation

## 2021-02-01 NOTE — Progress Notes (Signed)
GYNECOLOGIC ONCOLOGY NEW PATIENT CONSULTATION   Patient Name: Jennifer Watkins  Patient Age: 75 y.o. Date of Service: 02/01/21 Referring Provider: Dr. 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  I spent quite some time reviewing imaging findings with Jennifer Watkins and her friend. We looked at the CT images together and discussed structures that appear to be involved. The patient was understandably upset and surprised by this news.  From a disease history, delayed recurrence is not uncommon in the setting of granulosa cell tumor. We will plan to recheck a CA-125 today as well as obtain an inhibin B level (this was a tumor marker for her at the time of diagnosis). If tumor markers do not support the likely diagnosis of recurrent GCT, then we may need to discuss percutaneous biopsy.  From a treatment standpoint, I reviewed that surgical debulking is often an important tool in the treatment of recurrent GCT. Surgery is helpful when debulking appears feasible (based on imaging findings) and when the patient is a good surgical candidate. Given the proximity of her cardiac event, I am concerned that surgery in the next 90 days would carry a significant risk. I will wait for input from Dr. Virgina Jock, but my recommendation would be to delay any large surgery for at least 3 months. In the interim, I would recommend that we move forward with systemic therapy. For granulosa cell tumor, this could be either carboplatin/taxol or BEP. Given preliminary findings of GOG 264 and the improved toxicity profile with CT, I would recommend carboplatin/taxol.  The patient was significant reservations about systemic therapy. She and I spent  a long time today talking about the chemotherapy agents, their administration, side effects, and expected goals with treatment. She was amenable to me scheduling a consultation with our medical oncologist, Dr. Alvy Bimler. She would also like to speak to her previous oncologist, my partner, Dr. Clarene Essex. I will reach out to him today and relay my discussion with him.  If she decides to proceed with recommendations for systemic treatment, we would plan to administer 3 cycles of chemotherapy and repeat imaging to help guide decisions about surgery once her risk from a cardiac standpoint has decreased.   A copy of this note was sent to the patient's referring provider.   90 minutes of total time was spent for this patient encounter, including preparation, face-to-face counseling with the patient and coordination of care, and documentation of the encounter.   Jeral Pinch, MD  Division of Gynecologic Oncology  Department of Obstetrics and Gynecology  [facility]  ___________________________________________  Chief Complaint: Chief Complaint  Patient presents with  . Granulosa cell carcinoma of ovary, unspecified laterality (    History of Present Illness:  Jennifer Watkins is a 75 y.o. y.o. female who is seen in consultation at the request of Dr. Virgina Jock for an evaluation of bilateral abdominopelvic masses concerning for recurrence of granulosa cell tumor.  Patient's history is notable for early-stage granulosa cell tumor diagnosed after surgery in early 2009.  At that time, she had elevation of CA-125 and inhibin.  Given early stage, she was dispositioned to close observation.  Last week, the patient presented to the emergency department after an episode of chest pain.  This episode lasted several hours  but resolved prior to her presentation.  Her troponin was > 12,000 and EKG had T wave abnormalities.  Given increase in her second troponin level, patient was admitted  for observation.  During that admission, she underwent cardiac catheterization that showed coronary artery disease (had 100% proximal occlusion of the left circumflex).  An echocardiogram showed global hypokinesis with an ejection fraction of 40%.  During her work-up, she underwent CT of the abdomen and pelvis that showed bilateral complex masses that appear to be originating from the gonadal vessels as well as retroperitoneal adenopathy.  From a cardiac standpoint, medical therapy was recommended with aspirin, Plavix, statin Entresto, and Lasix.  She is scheduled for a follow-up visit with her cardiologist tomorrow.  Today, the patient reports being in her normal state of health until she developed left arm pain and malaise after swimming one day last week. Since discharge, she has been feeling well. She was able to good to church yesterday. She describes some decreased appetite around the time of needing to have an abscessed wisdom tooth removed last fall. She had some weight loss related to decreased intake. Both her appetite and weight improved afterward. She denies abdominal or pelvic pain, early satiety, nausea or emesis. She endorses bowel and bladder function are at baseline.   The patient works at [employer] 4 days a week. She lives alone in [place]. She is here with her friend, Jennifer Watkins.   Treatment History: Oncology History  Granulosa cell carcinoma ([place])  11/2007 Tumor Marker   Patient's tumor was tested for the following markers: CA-125, Inhibin. Results of the tumor marker test revealed: 999, 1067.   11/2007 Surgery   TAH/BSO, staging   11/2007 Pathologic Stage   Stage IA granulosa cell tumor   11/2007 Initial Diagnosis   Granulosa cell carcinoma ([place])   01/28/2021 Imaging   CT A/P: IMPRESSION: Bilateral large abdominal mass is intimately associated with the gonadal vessels in keeping with bilateral ovarian neoplasm. Extensive pathologic retroperitoneal adenopathy. Trace  ascites.   Moderate pulmonary edema. Anasarca with small bilateral pleural effusions and mild diffuse body wall subcutaneous edema.   Moderate left hydronephrosis secondary to an obstructing 7 mm x 10 mm calculus within the left ureteropelvic junction. Superimposed minimal left nonobstructing nephrolithiasis.   Mild coronary artery calcification.   Aortic Atherosclerosis (ICD10-I70.0).   02/12/2021 -  Chemotherapy    Patient is on Treatment Plan: OVARIAN CARBOPLATIN (AUC 6) / PACLITAXEL (175) Q21D X 6 CYCLES        PAST MEDICAL HISTORY:  Past Medical History:  Diagnosis Date  . Myocardial infarction ([place]) 0302/2022  . Ovarian cancer ([place])    ovarian     PAST SURGICAL HISTORY:  Past Surgical History:  Procedure Laterality Date  . LEFT HEART CATH AND CORONARY ANGIOGRAPHY N/A 01/28/2021   Procedure: LEFT HEART CATH AND CORONARY ANGIOGRAPHY;  Surgeon: Nigel Mormon, MD;  Location: [facility];  Service: Cardiovascular;  Laterality: N/A;  . TOTAL ABDOMINAL HYSTERECTOMY W/ BILATERAL SALPINGOOPHORECTOMY    . TOTAL HIP ARTHROPLASTY      OB/GYN HISTORY:  OB History  No obstetric history on file.    No LMP recorded. Patient is postmenopausal.  Age at menarche: in her teens  Age at menopause: at the time of her hysterectomy in 2009 Hx of HRT: denies Hx of STDs: denies Last pap: 2010 History of abnormal pap smears: denies  SCREENING STUDIES:  Last mammogram: 2009  Last colonoscopy: has never had  MEDICATIONS: Outpatient Encounter  Medications as of 02/01/2021  Medication Sig  . aspirin EC 81 MG EC tablet Take 1 tablet (81 mg total) by mouth daily. Swallow whole.  Marland Kitchen atorvastatin (LIPITOR) 80 MG tablet Take 1 tablet (80 mg total) by mouth daily at 6 PM.  . clopidogrel (PLAVIX) 75 MG tablet Take 1 tablet (75 mg total) by mouth daily.  . furosemide (LASIX) 40 MG tablet Take 1 tablet (40 mg total) by mouth daily.  . metoprolol succinate (TOPROL-XL) 50 MG 24 hr  tablet Take 1 tablet (50 mg total) by mouth daily. Take with or immediately following a meal.  . nitroGLYCERIN (NITROSTAT) 0.4 MG SL tablet Place 1 tablet (0.4 mg total) under the tongue every 5 (five) minutes x 3 doses as needed for chest pain.  . potassium chloride SA (KLOR-CON) 20 MEQ tablet Take 2 tablets (40 mEq total) by mouth daily.  . sacubitril-valsartan (ENTRESTO) 24-26 MG Take 1 tablet by mouth 2 (two) times daily.   No facility-administered encounter medications on file as of 02/01/2021.    ALLERGIES:  No Known Allergies   FAMILY HISTORY:  Family History  Problem Relation Age of Onset  . Heart Problems Mother   . Breast cancer Mother   . Heart Problems Father   . Non-Hodgkin's lymphoma Father   . Prostate cancer Brother   . Ovarian cancer Neg Hx   . Uterine cancer Neg Hx      SOCIAL HISTORY:  Social Connections: Not on file    REVIEW OF SYSTEMS:  + diarrhea Denies appetite changes, fevers, chills, fatigue, unexplained weight changes. Denies hearing loss, neck lumps or masses, mouth sores, ringing in ears or voice changes. Denies cough or wheezing.  Denies shortness of breath. Denies chest pain or palpitations. Denies leg swelling. Denies abdominal distention, pain, blood in stools, constipation, nausea, vomiting, or early satiety. Denies pain with intercourse, dysuria, frequency, hematuria or incontinence. Denies hot flashes, pelvic pain, vaginal bleeding or vaginal discharge.   Denies joint pain, back pain or muscle pain/cramps. Denies itching, rash, or wounds. Denies dizziness, headaches, numbness or seizures. Denies swollen lymph nodes or glands, denies easy bruising or bleeding. Denies anxiety, depression, confusion, or decreased concentration.  Physical Exam:  Vital Signs for this encounter:  Blood pressure (!) 148/88, pulse 93, temperature (!) 97 F (36.1 C), temperature source Tympanic, resp. rate 20, height 5' 2.72" (1.593 m), weight 148 lb (67.1 kg),  SpO2 100 %. Body mass index is 26.45 kg/m. General: Alert, oriented, no acute distress.  HEENT: Normocephalic, atraumatic. Sclera anicteric.  Chest: Clear to auscultation bilaterally. No wheezes, rhonchi, or rales. Cardiovascular: Regular rate and rhythm, no murmurs, rubs, or gallops.  Abdomen: Normoactive to mildly hyperactive bowel sounds. Soft, although masses appreciated in the right upper quadrant and left mid quadrant. These masses are both palpably firm and visible. Abdomen is nontender to palpation. No hepatosplenomegaly appreciated. Midline incision well healed.  Extremities: Grossly normal range of motion. Warm, well perfused. No edema bilaterally.  Skin: No rashes or lesions.  Lymphatics: No cervical, supraclavicular, or inguinal adenopathy.  GU:  Normal external female genitalia. No lesions. No discharge or bleeding.             Bladder/urethra:  No lesions or masses, well supported bladder             Vagina: mildly atrophic, no lesions or masses.             Cervix: surgically absent.  Uterus:surgically absent.             Adnexa: Only appreciated with abdominal hand. No nodularity appreciated on vaginal exam.  Rectal: No nodularity, rectum free.  LABORATORY AND RADIOLOGIC DATA:  Outside medical records were reviewed to synthesize the above history, along with the history and physical obtained during the visit.

## 2021-02-01 NOTE — Progress Notes (Signed)
START ON PATHWAY REGIMEN - Ovarian     A cycle is every 21 days:     Paclitaxel      Carboplatin   **Always confirm dose/schedule in your pharmacy ordering system**  Patient Characteristics: Recurrent or Progressive Disease, Second Line, Platinum Sensitive and ? 6 Months Since Last Therapy, Not a Candidate for Secondary Debulking Surgery BRCA Mutation Status: Awaiting Test Results Therapeutic Status: Recurrent or Progressive Disease Line of Therapy: Second Line  Intent of Therapy: Non-Curative / Palliative Intent, Discussed with Patient

## 2021-02-01 NOTE — Progress Notes (Signed)
Met with Jennifer Watkins after her appointment with Dr. Berline Lopes.  Reviewed upcoming appointments and plan of care.  Also provided her with the [employer]  Encouraged her to call with any questions or concerns.

## 2021-02-01 NOTE — Patient Instructions (Signed)
It was a pleasure meeting you today. I will call you with lab results today once back. Based on your CT results, this is very suspicious for recurrence of your granulosa cell tumor.  You are scheduled to see Dr. Alvy Bimler next week to talk about treatment options further.

## 2021-02-02 ENCOUNTER — Encounter: Payer: Self-pay | Admitting: Cardiology

## 2021-02-02 ENCOUNTER — Other Ambulatory Visit: Payer: Self-pay | Admitting: Gynecologic Oncology

## 2021-02-02 ENCOUNTER — Ambulatory Visit: Payer: Medicare Other | Admitting: Cardiology

## 2021-02-02 VITALS — BP 141/85 | HR 90 | Temp 98.6°F | Resp 17 | Ht 62.0 in | Wt 146.0 lb

## 2021-02-02 DIAGNOSIS — C569 Malignant neoplasm of unspecified ovary: Secondary | ICD-10-CM

## 2021-02-02 DIAGNOSIS — I5020 Unspecified systolic (congestive) heart failure: Secondary | ICD-10-CM

## 2021-02-02 DIAGNOSIS — I25810 Atherosclerosis of coronary artery bypass graft(s) without angina pectoris: Secondary | ICD-10-CM | POA: Insufficient documentation

## 2021-02-02 LAB — CA 125: Cancer Antigen (CA) 125: 41.6 U/mL — ABNORMAL HIGH (ref 0.0–38.1)

## 2021-02-02 MED ORDER — ATORVASTATIN CALCIUM 80 MG PO TABS
80.0000 mg | ORAL_TABLET | Freq: Every day | ORAL | 3 refills | Status: DC
Start: 2021-02-02 — End: 2021-03-05

## 2021-02-02 MED ORDER — METOPROLOL SUCCINATE ER 50 MG PO TB24: 50.0000 mg | ORAL_TABLET | Freq: Every day | ORAL | 3 refills | Status: DC

## 2021-02-02 MED ORDER — FUROSEMIDE 40 MG PO TABS: 40.0000 mg | ORAL_TABLET | ORAL | 1 refills | Status: DC | PRN

## 2021-02-02 MED ORDER — ENTRESTO 49-51 MG PO TABS: 1.0000 | ORAL_TABLET | Freq: Two times a day (BID) | ORAL | 2 refills | Status: DC

## 2021-02-02 MED ORDER — ASPIRIN 81 MG PO TBEC: 81.0000 mg | DELAYED_RELEASE_TABLET | Freq: Every day | ORAL | 3 refills | Status: DC

## 2021-02-02 MED ORDER — CLOPIDOGREL BISULFATE 75 MG PO TABS: 75.0000 mg | ORAL_TABLET | Freq: Every day | ORAL | 3 refills | Status: DC

## 2021-02-02 NOTE — Progress Notes (Signed)
Follow up visit  Subjective:   Jennifer Watkins, female    DOB: 06/09/1946, 75 y.o.   MRN: [id number]   HPI  Chief Complaint  Patient presents with  . Coronary Artery Disease  . Transitions Of Care    75 y.o.Caucasianfemalewith untreated hyperlipidemia, h/o ovarian tumor s/p right oophorectomy and hysterectomy 2009, late presented acute coronary syndrome with high OM1 occlusion (01/2021), abdominal masses concerning for recurrence of granulosa cell tumor of ovary  Patient presented to [facility]21 after an episode of chest pain more than 12 hours ago.  EKG showed possible old anterior septal infarct, as well as inferolateral T wave inversion.  On presentation, patient was chest pain-free.  Her troponin was elevated to 17,000.  Coronary angiogram  showed occluded high OM1, likely the culprit vessel.  No other severe stenoses were found. Echocardiogram showed global hypokinesis with severely reduced global longitudinal strain, EF 40%.  Patient has not had any recurrent chest pain or shortness of breath. She also denies orthopnea, PND, leg edema.   During the hospital admission, she was found to have large abdominal masses on physical exam, subsequently confirmed on CT scan, along with retroperitoneal adenopathy. Patient was seen by Dr> Jeral Pinch. Given concern for recurrence of granulosa cell tumor, she felt that patient would need surgery and consideration for systemic therapy with carboplatin/taxol or BEP. She also felt that surgry should be deferred by 3 months given her recent MI.   Patient is unsure at this time if she wants to pursue chemotherapy, and is also looking into other alternatives.   Current Outpatient Medications on File Prior to Visit  Medication Sig Dispense Refill  . aspirin EC 81 MG EC tablet Take 1 tablet (81 mg total) by mouth daily. Swallow whole. 30 tablet 1  . atorvastatin (LIPITOR) 80 MG tablet Take 1 tablet (80 mg total) by mouth daily  at 6 PM. 30 tablet 1  . clopidogrel (PLAVIX) 75 MG tablet Take 1 tablet (75 mg total) by mouth daily. 30 tablet 1  . furosemide (LASIX) 40 MG tablet Take 1 tablet (40 mg total) by mouth daily. 30 tablet 1  . metoprolol succinate (TOPROL-XL) 50 MG 24 hr tablet Take 1 tablet (50 mg total) by mouth daily. Take with or immediately following a meal. 30 tablet 1  . nitroGLYCERIN (NITROSTAT) 0.4 MG SL tablet Place 1 tablet (0.4 mg total) under the tongue every 5 (five) minutes x 3 doses as needed for chest pain. 30 tablet 1  . potassium chloride SA (KLOR-CON) 20 MEQ tablet Take 2 tablets (40 mEq total) by mouth daily. 30 tablet 1  . sacubitril-valsartan (ENTRESTO) 24-26 MG Take 1 tablet by mouth 2 (two) times daily. 60 tablet 1   No current facility-administered medications on file prior to visit.    Cardiovascular & other pertient studies:  EKG 01/2021: Sinus rhythm 86 bpm  Old anterior infarct Low voltage in limb leads  Diffuse nonspecific T-abnormality   Recent labs: 01/29/2021: Glucose 147, BUN/Cr 14/0.64. EGFR >60. Na/K 135/3.0. H/H 11/32. MCV 83. Platelets 207 HbA1C N/A Chol 240, TG 128, HDL 45, LDL 169 TSH N/A    Review of Systems  Cardiovascular: Negative for chest pain, dyspnea on exertion, leg swelling, palpitations and syncope.         Vitals:   02/02/21 1408 02/02/21 1414  BP: (!) 152/89 (!) 141/85  Pulse: 90 90  Resp: 17   Temp: 98.6 F (37 C)   SpO2: 98%  Body mass index is 26.7 kg/m. Filed Weights   02/02/21 1408  Weight: 146 lb (66.2 kg)     Objective:   Physical Exam Vitals and nursing note reviewed.  Constitutional:      General: She is not in acute distress. Neck:     Vascular: No JVD.  Cardiovascular:     Rate and Rhythm: Normal rate and regular rhythm.     Heart sounds: Normal heart sounds. No murmur heard.   Pulmonary:     Effort: Pulmonary effort is normal.     Breath sounds: Normal breath sounds. No wheezing or rales.  Abdominal:      Comments: Firm b/l abdominal masses  Musculoskeletal:     Right lower leg: No edema.     Left lower leg: No edema.           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 No benefit with revascularization given late presentation. No angina symptoms.  Recommend Aspirin/plavix Continue high dose statin, lipitor 80 mg Repeat lipid panel in 2 weeks Continue metoprolol succinate 50 mg  HFrEF: EF 40%. Clinically euvolemic. NYHA class II Continue metoprolol succinate 50 mg daily Increase Entresto to 49-51 mg bid Check BMP in 2 weeks  Risk stratification with regards to chemotherapy/ surgical management of abdominal masses: I agree with Dr. Berline Lopes that we should try to defer the surgery by 3 months post MI, if safely possible, to reduce perioperative cardiac risk. In the meantime., I will continue medical optimization of her CAD and mild systolic heart failure. She does have reduced GLS on strain imaging at baseline, which does increase risk of cardiotoxicity. While this should not preclude her receiving carboplatin, I would recommend surveillance echocardiogram with strain, and continued treatment with guideline directed heart failure therapy. Separately, patient did express her wishes to seek alternative treatment modalities. I encouraged her to further discuss this with Dr. Berline Lopes.   F/u in 4 weeks    Nigel Mormon, MD Pager: [phone] Office: [phone]

## 2021-02-04 ENCOUNTER — Telehealth: Payer: Self-pay | Admitting: *Deleted

## 2021-02-04 ENCOUNTER — Telehealth: Payer: Self-pay | Admitting: Gynecologic Oncology

## 2021-02-04 LAB — INHIBIN B: Inhibin B: 5544.5 pg/mL — ABNORMAL HIGH (ref 0.0–16.9)

## 2021-02-04 NOTE — Telephone Encounter (Signed)
Returned the patient's call to discuss her recent lab testing.  Her CA-125 is the only test that is back thus far.  We discussed the result, which is increased just above the upper limits of normal.  Her inhibin B and AMH are still pending and likely will be back into the middle of next week.  The inhibin B I think will be the most helpful in helping determine whether this is likely recurrent granulosa cell tumor or possibly a different process.  If her inhibin B is normal, then I think it may be worth discussing pursuing a biopsy to help establish a diagnosis.  Now that the patient has met with her cardiologist and spent some time researching treatment options, she voices that she does not want to move forward with systemic treatment with chemotherapy.  She understands that deferring treatment for 3 months with a plan to repeat a scan once it would be safer from a cardiac standpoint to move forward with scheduling surgery may mean that we miss our surgical window if her disease is not debulkable.  The patient requested that her appointment for chemo as well as with Dr. Alvy Bimler be canceled next week.  I passed that information along to Santiago Glad.  I will speak with her next week once the remainder of her test results come back.  Jeral Pinch MD Gynecologic Oncology

## 2021-02-04 NOTE — Telephone Encounter (Signed)
Called patient and informed her of inhibin B results. Advised patient of Dr. Charisse March recommendations including no need for a biopsy at this time given the markedly elevated level. Patient became very angry on the phone. She stated she did not like being communicated results in this manner. She stated she talked with Dr. Berline Lopes about the need for a possible biopsy and now she is being told she doesn't need it which frustrates her. Attempted to recover the situation by offering an office visit etc but patient remaining angry and voicing frustration. She stated she will think about it and maybe call back. Dr. Berline Lopes made aware of this phone interaction.

## 2021-02-04 NOTE — Telephone Encounter (Signed)
Patient called and left a message stating "I wanted to check on my lab results since I have not heard back from Dr Berline Lopes. Also I have decided not to move forward with the chemo therapy due the cardio side effects and my recent heart attack. I would like to cancel both the appt with Dr Alvy Bimler, chemo class and chemo appt. I would like to discuss all this with Dr Berline Lopes on the phone if possible."  Returned the patient's call and left a message that we received her message. Explained that the message will be forwarded to Dr Berline Lopes, and she would receive a call later today or tomorrow.

## 2021-02-05 ENCOUNTER — Telehealth: Payer: Self-pay | Admitting: Gynecologic Oncology

## 2021-02-05 ENCOUNTER — Telehealth: Payer: Self-pay | Admitting: Oncology

## 2021-02-05 DIAGNOSIS — C577 Malignant neoplasm of other specified female genital organs: Secondary | ICD-10-CM

## 2021-02-05 DIAGNOSIS — C569 Malignant neoplasm of unspecified ovary: Secondary | ICD-10-CM

## 2021-02-05 NOTE — Telephone Encounter (Signed)
Jennifer Watkins with CT scan and lab appointments on 05/07/21 and instructions to pick up contrast at the cancer center anytime before 05/07/21.  Also advised her of appointment to follow up with Dr. Berline Lopes on 05/11/21 at 1:45.  She verbalized understanding and agreement of appointments and instructions.

## 2021-02-05 NOTE — Telephone Encounter (Signed)
Spoke with the patient on 3/10 after her phone call with Melissa. She voiced again feeling as though things were communicated to her in a "cold" manner. I asked her to clarify what this meant. She had been given the value of her inhibin B as well as my recommendation that I do not think a biopsy is indicated or necessary.   I reminded her that we had talked for about 15 minutes several hours before and reviewed various scenarios (if inhibin B was elevated, I did not think a biopsy was necessary to confirm diagnosis; if inhibin B normal, then I think reasonable to move forward with biopsy to establish diagnosis). The patient had previously called the office asking for results of lab tests. Thus, when the inhibin B came back earlier then expected, someone from my office had communicated those results to her (she seemed anxious to know the results) as well as what the results meant in terms of the indication for a biopsy. I did not think further detail about what this meant was necessary given our recent and long conversation just several hours before.   Kura appeared to understand why information had been communicated the way it had been. I have voiced during our visit and multiple phone conversations that I respect her autonomy. She has chosen not to pursue chemotherapy understanding that this may mean that when she could be at lower risk from a cardiac standpoint to undergo surgery, she may no longer be a candidate for surgery. I offered to get her scheduled for a follow-up visit and repeat CT scan in 3 months which she would like to schedule.  Given the way in which she describes wanting to get information, I suggested that we plan to have communication only between the two of [place]. I will ask my office to simply take a message if she calls. She understands that this may mean there will be delay in receiving a call back from me.   Jeral Pinch MD Gynecologic Oncology

## 2021-02-08 ENCOUNTER — Other Ambulatory Visit: Payer: Self-pay | Admitting: Oncology

## 2021-02-08 NOTE — Progress Notes (Signed)
Gynecologic Oncology Multi-Disciplinary Disposition Conference Note  Date of the Conference: 02/08/2021  Patient Name: Jennifer Watkins  Referring Provider: Dr. Virgina Jock Primary GYN Oncologist: Dr. 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 Disposition is to systemic chemotherapy with carboplatin/Taxol for 3 cycles followed by repeat imaging and consideration for surgery. However, patient has declined systemic treatment.   This Multidisciplinary conference took place involving physicians from [place], [facility], Radiation Oncology, Pathology, Radiology along with the Gynecologic Oncology Nurse Practitioner and RN.  Comprehensive assessment of the patient's malignancy, staging, need for surgery, chemotherapy, radiation therapy, and need for further testing were reviewed. Supportive measures, both inpatient and following discharge were also discussed. The recommended plan of care is documented. Greater than 35 minutes were spent correlating and coordinating this patient's care.

## 2021-02-09 ENCOUNTER — Telehealth: Payer: Self-pay | Admitting: *Deleted

## 2021-02-09 NOTE — Telephone Encounter (Signed)
Patient called back and scheduled a genetics/lab appt

## 2021-02-09 NOTE — Telephone Encounter (Signed)
Late entry----called the patient yesterday to schedule a genetics appts; patient was at the store and stated she would call the office back. Patient called yesterday afternoon at 3 pm and left a message to call her back.   Called the patient this morning and left a message to call the office back

## 2021-02-10 ENCOUNTER — Other Ambulatory Visit: Payer: Medicare Other

## 2021-02-10 ENCOUNTER — Other Ambulatory Visit: Payer: Self-pay | Admitting: Cardiology

## 2021-02-10 ENCOUNTER — Ambulatory Visit: Payer: Medicare Other | Admitting: Hematology and Oncology

## 2021-02-10 DIAGNOSIS — N179 Acute kidney failure, unspecified: Secondary | ICD-10-CM

## 2021-02-10 LAB — BASIC METABOLIC PANEL
BUN/Creatinine Ratio: 19 (ref 12–28)
BUN: 22 mg/dL (ref 8–27)
CO2: 23 mmol/L (ref 20–29)
Calcium: 10 mg/dL (ref 8.7–10.3)
Chloride: 95 mmol/L — ABNORMAL LOW (ref 96–106)
Creatinine, Ser: 1.15 mg/dL — ABNORMAL HIGH (ref 0.57–1.00)
Glucose: 118 mg/dL — ABNORMAL HIGH (ref 65–99)
Potassium: 4.5 mmol/L (ref 3.5–5.2)
Sodium: 135 mmol/L (ref 134–144)
eGFR: 50 mL/min/{1.73_m2} — ABNORMAL LOW (ref >59)

## 2021-02-10 LAB — LIPID PANEL
Chol/HDL Ratio: 5.2 ratio — ABNORMAL HIGH (ref 0.0–4.4)
Cholesterol, Total: 172 mg/dL (ref 100–199)
HDL: 33 mg/dL — ABNORMAL LOW (ref >39)
LDL Chol Calc (NIH): 116 mg/dL — ABNORMAL HIGH (ref 0–99)
Triglycerides: 127 mg/dL (ref 0–149)
VLDL Cholesterol Cal: 23 mg/dL (ref 5–40)

## 2021-02-10 NOTE — Progress Notes (Signed)
Please check if she has any 24-26 mg pills left  Thanks MJP

## 2021-02-11 NOTE — Progress Notes (Signed)
Paper work needs to be filled out by the 31st, sorry

## 2021-02-11 NOTE — Progress Notes (Signed)
Called and spoke with pt regarding entresto. Pt wants to inofrm you that her bp on Sunday was 125/82. She has also dropped off some paperwork from her job that needs to be filled out before the 30st regarding her leave os absence. She also is unable to message you on mychart and is not sure why.

## 2021-02-12 ENCOUNTER — Ambulatory Visit: Payer: Medicare Other

## 2021-02-17 ENCOUNTER — Encounter: Payer: Self-pay | Admitting: Genetic Counselor

## 2021-02-17 ENCOUNTER — Telehealth: Payer: Self-pay | Admitting: *Deleted

## 2021-02-17 ENCOUNTER — Telehealth: Payer: Self-pay | Admitting: Genetic Counselor

## 2021-02-17 DIAGNOSIS — Z01810 Encounter for preprocedural cardiovascular examination: Secondary | ICD-10-CM | POA: Insufficient documentation

## 2021-02-17 DIAGNOSIS — Z1379 Encounter for other screening for genetic and chromosomal anomalies: Secondary | ICD-10-CM | POA: Insufficient documentation

## 2021-02-17 LAB — BASIC METABOLIC PANEL
BUN/Creatinine Ratio: 17 (ref 12–28)
BUN: 12 mg/dL (ref 8–27)
CO2: 23 mmol/L (ref 20–29)
Calcium: 10 mg/dL (ref 8.7–10.3)
Chloride: 100 mmol/L (ref 96–106)
Creatinine, Ser: 0.7 mg/dL (ref 0.57–1.00)
Glucose: 94 mg/dL (ref 65–99)
Potassium: 4.9 mmol/L (ref 3.5–5.2)
Sodium: 139 mmol/L (ref 134–144)
eGFR: 91 mL/min/{1.73_m2} (ref >59)

## 2021-02-17 LAB — LIPID PANEL
Chol/HDL Ratio: 4.7 ratio — ABNORMAL HIGH (ref 0.0–4.4)
Cholesterol, Total: 150 mg/dL (ref 100–199)
HDL: 32 mg/dL — ABNORMAL LOW (ref >39)
LDL Chol Calc (NIH): 98 mg/dL (ref 0–99)
Triglycerides: 106 mg/dL (ref 0–149)
VLDL Cholesterol Cal: 20 mg/dL (ref 5–40)

## 2021-02-17 NOTE — Telephone Encounter (Signed)
Patient is calling and stated " I need to see if she will have any co pays to pay tomorrow at my appt time.I would like this information before her appt tomorrow, so I can cancel if I doesn't have the money and reschedule to next month" Forwarded the patient to the genetic counselor and also sent a message to the counselor. Explained that the office will call her back with the information

## 2021-02-17 NOTE — Telephone Encounter (Signed)
Called the patient and left a message that Jennifer Watkins from genetics called her back. Left a message to call the office when she wants to reschedule the genetics appt

## 2021-02-17 NOTE — Telephone Encounter (Addendum)
Returned Jennifer Watkins call to discuss her questions regarding OOP cost for genetics appointment.  Provided CPT codes for appointment to check with her insurance.  Discussed that I do not know what her OOP would be, but most patients with Medicare do not have an OOP or have an OOP below $50 for the appointment.    We discussed that given the available information about her PH and FH, she has a low chance of carrying a hereditary mutation associated with cancer risk.  Jennifer Watkins was made aware that genetic testing is not recommended based on her [place] and FH cancer but available if she is interested.  Jennifer Watkins declined genetic testing at this point in time and wishes to cancel appointment scheduled for tomorrow. She has my contact information if she is interested in this information in the future.

## 2021-02-18 ENCOUNTER — Other Ambulatory Visit: Payer: Medicare Other

## 2021-02-18 ENCOUNTER — Encounter: Payer: Medicare Other | Admitting: Genetic Counselor

## 2021-02-22 ENCOUNTER — Encounter: Payer: Self-pay | Admitting: Gynecologic Oncology

## 2021-03-03 ENCOUNTER — Telehealth: Payer: Self-pay | Admitting: *Deleted

## 2021-03-03 ENCOUNTER — Ambulatory Visit: Payer: Medicare Other | Admitting: Cardiology

## 2021-03-03 NOTE — Telephone Encounter (Signed)
Returned the patient's call regarding canceling her appts for lab, CT chest, post op and surgery.  Patient sated that she is seeing Dr Clarene Essex at [facility] and he will perform her surgery.

## 2021-03-05 ENCOUNTER — Encounter: Payer: Self-pay | Admitting: Cardiology

## 2021-03-05 ENCOUNTER — Ambulatory Visit: Payer: Medicare Other | Admitting: Cardiology

## 2021-03-05 ENCOUNTER — Other Ambulatory Visit: Payer: Self-pay

## 2021-03-05 VITALS — BP 135/81 | HR 79 | Temp 98.7°F | Resp 16 | Ht 62.0 in | Wt 142.0 lb

## 2021-03-05 DIAGNOSIS — I25810 Atherosclerosis of coronary artery bypass graft(s) without angina pectoris: Secondary | ICD-10-CM

## 2021-03-05 DIAGNOSIS — I5020 Unspecified systolic (congestive) heart failure: Secondary | ICD-10-CM

## 2021-03-05 MED ORDER — FUROSEMIDE 40 MG PO TABS
40.0000 mg | ORAL_TABLET | ORAL | 1 refills | Status: DC | PRN
Start: 2021-03-05 — End: 2021-03-31

## 2021-03-05 MED ORDER — ENTRESTO 24-26 MG PO TABS: 1.0000 | ORAL_TABLET | Freq: Two times a day (BID) | ORAL | 2 refills | Status: DC

## 2021-03-05 MED ORDER — NITROGLYCERIN 0.4 MG SL SUBL: 0.4000 mg | SUBLINGUAL_TABLET | SUBLINGUAL | 1 refills | Status: AC | PRN

## 2021-03-05 MED ORDER — ATORVASTATIN CALCIUM 80 MG PO TABS
80.0000 mg | ORAL_TABLET | Freq: Every day | ORAL | 3 refills | Status: DC
Start: 2021-03-05 — End: 2021-09-24

## 2021-03-05 MED ORDER — METOPROLOL SUCCINATE ER 50 MG PO TB24: 50.0000 mg | ORAL_TABLET | Freq: Every day | ORAL | 3 refills | Status: DC

## 2021-03-05 MED ORDER — ASPIRIN 81 MG PO TBEC: 81.0000 mg | DELAYED_RELEASE_TABLET | Freq: Every day | ORAL | 3 refills | Status: DC

## 2021-03-05 NOTE — Progress Notes (Signed)
Follow up visit  Subjective:   Jennifer Watkins, female    DOB: 1946/05/18, 75 y.o.   MRN: [id number]   HPI   Chief Complaint  Patient presents with  . Coronary Artery Disease  . Congestive Heart Failure  . 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  EKG showed possible old anterior septal infarct, as well as inferolateral T wave inversion.  On presentation, patient was chest pain-free.  Her troponin was elevated to 17,000.  Coronary angiogram  showed occluded high OM1, likely the culprit vessel.  No other severe stenoses were found. Echocardiogram showed global hypokinesis with severely reduced global longitudinal strain, EF 40%.  Patient has not had any recurrent chest pain or shortness of breath. She also denies orthopnea, PND, leg edema.   During the hospital admission, she was found to have large abdominal masses on physical exam, subsequently confirmed on CT scan, along with retroperitoneal adenopathy. Patient was seen by Dr. Jeral Pinch here in [place], and is now following up with Dr. Clarene Essex at [facility].  Chemotherapy was discussed with the patient, but she opted for surgery which is tentatively planned for June 2022.   Cardiac standpoint, patient is doing well.  She denies chest pain, shortness of breath, palpitations, leg edema, orthopnea, PND, TIA/syncope.  He is walking up to 45-minute without any significant complaints.  She is compliant with medical therapy.  Blood pressure is not controlled.  Current Outpatient Medications on File Prior to Visit  Medication Sig Dispense Refill  . aspirin 81 MG EC tablet Take 1 tablet (81 mg total)  by mouth daily. Swallow whole. 90 tablet 3  . atorvastatin (LIPITOR) 80 MG tablet Take 1 tablet (80 mg total) by mouth daily at 6 PM. 90 tablet 3  . clopidogrel (PLAVIX) 75 MG tablet Take 1 tablet (75 mg total) by mouth daily. 90 tablet 3  . furosemide (LASIX) 40 MG tablet Take 1 tablet (40 mg total) by mouth as needed for edema. 30 tablet 1  . metoprolol succinate (TOPROL-XL) 50 MG 24 hr tablet Take 1 tablet (50 mg total) by mouth daily. Take with or immediately following a meal. 90 tablet 3  . nitroGLYCERIN (NITROSTAT) 0.4 MG SL tablet Place 1 tablet (0.4 mg total) under the tongue every 5 (five) minutes x 3 doses as needed for chest pain. 30 tablet 1  . potassium chloride SA (KLOR-CON) 20 MEQ tablet Take 2 tablets (40 mEq total) by mouth daily. 30 tablet 1  . sacubitril-valsartan (ENTRESTO) 49-51 MG Take 1 tablet by mouth 2 (two) times daily. 60 tablet 2   No current facility-administered medications on file prior to visit.    Cardiovascular & other pertient studies:  EKG 01/2021: Sinus rhythm 86 bpm  Old anterior infarct Low voltage in limb leads  Diffuse nonspecific T-abnormality   Recent labs: 3/22/20222: Glucose 94, BUN/Cr 12/0.7. EGFR 91. Na/K 139/4.9.  Chol 150, TG 106, HDL 32, LDL 98  01/29/2021: Glucose 147, BUN/Cr 14/0.64. EGFR >60. Na/K 135/3.0. H/H 11/32. MCV 83. Platelets 207 HbA1C N/A Chol 240, TG 128, HDL 45, LDL 169 TSH N/A    Review of Systems  Cardiovascular: Negative for chest pain, dyspnea on exertion, leg swelling, palpitations  and syncope.         Vitals:   03/05/21 1208  BP: 135/81  Pulse: 79  Resp: 16  Temp: 98.7 F (37.1 C)  SpO2: 96%     Body mass index is 25.97 kg/m. Filed Weights   03/05/21 1208  Weight: 142 lb (64.4 kg)     Objective:   Physical Exam Vitals and nursing note reviewed.  Constitutional:      General: She is not in acute distress. Neck:     Vascular: No JVD.  Cardiovascular:     Rate and Rhythm: Normal rate  and regular rhythm.     Heart sounds: Normal heart sounds. No murmur heard.   Pulmonary:     Effort: Pulmonary effort is normal.     Breath sounds: Normal breath sounds. No wheezing or rales.  Abdominal:     Comments: Firm b/l abdominal masses  Musculoskeletal:     Right lower leg: No edema.     Left lower leg: No edema.           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 No benefit with revascularization given late presentation. No angina symptoms.  Recommend Aspirin/plavix Continue high dose statin, lipitor 80 mg. LDL 98, improved from prior. 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 Clinically euvolemic. NYHA class II Continue metoprolol succinate 50 mg daily, Entresto 24-26 mg bid  F/u in May 2022 before surgery Okay to return to work   Nigel Mormon, MD Pager: [phone] Office: [phone]

## 2021-03-09 ENCOUNTER — Other Ambulatory Visit: Payer: Self-pay | Admitting: Gynecologic Oncology

## 2021-03-18 ENCOUNTER — Telehealth: Payer: Self-pay | Admitting: *Deleted

## 2021-03-18 NOTE — Telephone Encounter (Signed)
Per Dr Berline Lopes scheduled the patient for a CT scan; called the patient and left a message to call the office back. Patinet needs to be scheduled for a lab appt and pick up contrast.

## 2021-03-27 ENCOUNTER — Emergency Department (HOSPITAL_COMMUNITY)
Admission: EM | Admit: 2021-03-27 | Discharge: 2021-03-27 | Disposition: A | Discharge status: Home / Self Care | Payer: Medicare Other | Attending: Emergency Medicine | Admitting: Emergency Medicine

## 2021-03-27 DIAGNOSIS — I2510 Atherosclerotic heart disease of native coronary artery without angina pectoris: Secondary | ICD-10-CM | POA: Insufficient documentation

## 2021-03-27 DIAGNOSIS — R42 Dizziness and giddiness: Secondary | ICD-10-CM | POA: Diagnosis not present

## 2021-03-27 DIAGNOSIS — R55 Syncope and collapse: Secondary | ICD-10-CM | POA: Insufficient documentation

## 2021-03-27 DIAGNOSIS — R531 Weakness: Secondary | ICD-10-CM | POA: Diagnosis not present

## 2021-03-27 DIAGNOSIS — Z79899 Other long term (current) drug therapy: Secondary | ICD-10-CM | POA: Insufficient documentation

## 2021-03-27 DIAGNOSIS — Z96649 Presence of unspecified artificial hip joint: Secondary | ICD-10-CM | POA: Diagnosis not present

## 2021-03-27 DIAGNOSIS — Z7982 Long term (current) use of aspirin: Secondary | ICD-10-CM | POA: Insufficient documentation

## 2021-03-27 DIAGNOSIS — Z8543 Personal history of malignant neoplasm of ovary: Secondary | ICD-10-CM | POA: Insufficient documentation

## 2021-03-27 DIAGNOSIS — Z951 Presence of aortocoronary bypass graft: Secondary | ICD-10-CM | POA: Insufficient documentation

## 2021-03-27 DIAGNOSIS — Z8679 Personal history of other diseases of the circulatory system: Secondary | ICD-10-CM

## 2021-03-27 DIAGNOSIS — R110 Nausea: Secondary | ICD-10-CM | POA: Insufficient documentation

## 2021-03-27 DIAGNOSIS — Z7902 Long term (current) use of antithrombotics/antiplatelets: Secondary | ICD-10-CM | POA: Diagnosis not present

## 2021-03-27 LAB — COMPREHENSIVE METABOLIC PANEL
ALT: 43 U/L (ref 0–44)
AST: 38 U/L (ref 15–41)
Albumin: 3 g/dL — ABNORMAL LOW (ref 3.5–5.0)
Alkaline Phosphatase: 283 U/L — ABNORMAL HIGH (ref 38–126)
Anion gap: 9 (ref 5–15)
BUN: 11 mg/dL (ref 8–23)
CO2: 23 mmol/L (ref 22–32)
Calcium: 8.9 mg/dL (ref 8.9–10.3)
Chloride: 100 mmol/L (ref 98–111)
Creatinine, Ser: 0.69 mg/dL (ref 0.44–1.00)
GFR, Estimated: >60 mL/min (ref >60)
Glucose, Bld: 175 mg/dL — ABNORMAL HIGH (ref 70–99)
Potassium: 3.7 mmol/L (ref 3.5–5.1)
Sodium: 132 mmol/L — ABNORMAL LOW (ref 135–145)
Total Bilirubin: 0.7 mg/dL (ref 0.3–1.2)
Total Protein: 5.5 g/dL — ABNORMAL LOW (ref 6.5–8.1)

## 2021-03-27 LAB — CBC WITH DIFFERENTIAL/PLATELET
Abs Immature Granulocytes: 0.02 10*3/uL (ref 0.00–0.07)
Basophils Absolute: 0 10*3/uL (ref 0.0–0.1)
Basophils Relative: 1 %
Eosinophils Absolute: 0.2 10*3/uL (ref 0.0–0.5)
Eosinophils Relative: 4 %
HCT: 32.2 % — ABNORMAL LOW (ref 36.0–46.0)
Hemoglobin: 10.2 g/dL — ABNORMAL LOW (ref 12.0–15.0)
Immature Granulocytes: 0 %
Lymphocytes Relative: 11 %
Lymphs Abs: 0.6 10*3/uL — ABNORMAL LOW (ref 0.7–4.0)
MCH: 27.6 pg (ref 26.0–34.0)
MCHC: 31.7 g/dL (ref 30.0–36.0)
MCV: 87 fL (ref 80.0–100.0)
Monocytes Absolute: 0.3 10*3/uL (ref 0.1–1.0)
Monocytes Relative: 6 %
Neutro Abs: 4.1 10*3/uL (ref 1.7–7.7)
Neutrophils Relative %: 78 %
Platelets: 218 10*3/uL (ref 150–400)
RBC: 3.7 MIL/uL — ABNORMAL LOW (ref 3.87–5.11)
RDW: 14.2 % (ref 11.5–15.5)
WBC: 5.3 10*3/uL (ref 4.0–10.5)
nRBC: 0 % (ref 0.0–0.2)

## 2021-03-27 LAB — TROPONIN I (HIGH SENSITIVITY)
Troponin I (High Sensitivity): 10 ng/L (ref <18)
Troponin I (High Sensitivity): 33 ng/L — ABNORMAL HIGH (ref <18)

## 2021-03-27 MED ORDER — SODIUM CHLORIDE 0.9 % IV BOLUS
500.0000 mL | Freq: Once | INTRAVENOUS | Status: AC
  Administered 2021-03-27: 500 mL via INTRAVENOUS

## 2021-03-27 NOTE — ED Notes (Signed)
MD notified of troponin of 33.

## 2021-03-27 NOTE — ED Provider Notes (Signed)
[facility] EMERGENCY DEPARTMENT Provider Note   CSN: [id number] Arrival date & time: 03/27/21  0102     History No chief complaint on file.   Jennifer Watkins is a 75 y.o. female.  Patient is a 75 year old female with past medical history of coronary artery disease with MI in March 2022.  Patient presented here with dizziness and weakness along with nausea and was found to have a markedly elevated troponin.  She did not experience chest pain along with this episode.  She had a heart cath performed showing a small vessel occlusion, with no intervention indicated.  EF at that time was 40% and patient discharged to home.  She was also found during that hospitalization to have abdominal masses, felt to be related to a recurrence of ovarian cancer she had years ago.  This evening at approximately 11:45 PM, she developed the same feeling of weakness, nausea, and near syncope that she experienced with her prior MI.  She called 911 and was transported here.  I am told she had a brief episode of hypotension while with EMS.  She did take nitroglycerin and 325 mg of aspirin prior to coming here.  The history is provided by the patient.       Past Medical History:  Diagnosis Date  . Hyperlipidemia   . Myocardial infarction ([place]) 0302/2022  . Ovarian cancer [facility])    ovarian    Patient Active Problem List   Diagnosis Date Noted  . Genetic testing 02/17/2021  . Coronary artery disease involving coronary bypass graft of native heart without angina pectoris 02/02/2021  . HFrEF (heart failure with reduced ejection fraction) ([place]) 02/02/2021  . 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 ACS (acute coronary syndrome) ([place]) 01/28/2021  . NSTEMI (non-ST elevated myocardial infarction) [facility])     Past Surgical History:  Procedure Laterality Date  . LEFT HEART CATH AND CORONARY ANGIOGRAPHY N/A 01/28/2021   Procedure: LEFT HEART CATH AND CORONARY ANGIOGRAPHY;  Surgeon: Nigel Mormon, MD;  Location: [facility];  Service: Cardiovascular;  Laterality: N/A;  . TOTAL ABDOMINAL HYSTERECTOMY W/ BILATERAL SALPINGOOPHORECTOMY    . TOTAL HIP ARTHROPLASTY       OB History   No obstetric history on file.     Family History  Problem Relation Age of Onset  . Heart Problems Mother   . Breast cancer Mother        dx after age 42  . Heart Problems Father   . Non-Hodgkin's lymphoma Father   . Prostate cancer Brother        dx after age 71; no mets  . Leukemia Sister 65  . Ovarian cancer Neg Hx   . Uterine cancer Neg Hx     Social History   Tobacco Use  . Smoking status: Never Smoker  . Smokeless tobacco: Never Used  Vaping Use  . Vaping Use: Never used  Substance Use Topics  . Alcohol use: Not Currently  . Drug use: Never    Home Medications Prior to Admission medications   Medication Sig Start Date End Date Taking? Authorizing Provider  aspirin 81 MG EC tablet Take 1 tablet (81 mg total) by mouth daily. Swallow whole. 03/05/21   Patwardhan, Reynold Bowen, MD  atorvastatin (LIPITOR) 80 MG tablet Take 1 tablet (80 mg total) by mouth daily at 6 PM. 03/05/21   Patwardhan, Reynold Bowen, MD  clopidogrel (PLAVIX) 75 MG tablet Take 1 tablet (75 mg total) by mouth daily.  02/02/21   Patwardhan, Reynold Bowen, MD  furosemide (LASIX) 40 MG tablet Take 1 tablet (40 mg total) by mouth as needed for edema. 03/05/21 03/05/22  Patwardhan, Reynold Bowen, MD  metoprolol succinate (TOPROL-XL) 50 MG 24 hr tablet Take 1 tablet (50 mg total) by mouth daily. Take with or immediately following a meal. 03/05/21   Patwardhan, Manish J, MD  nitroGLYCERIN (NITROSTAT) 0.4 MG SL tablet Place 1 tablet (0.4 mg total) under the tongue every 5 (five) minutes x 3 doses as needed for chest pain. 03/05/21   Patwardhan, Reynold Bowen, MD  sacubitril-valsartan (ENTRESTO) 24-26 MG Take 1 tablet by mouth 2 (two) times daily. 03/05/21   Patwardhan, Reynold Bowen, MD    Allergies    Patient has no known allergies.  Review of  Systems   Review of Systems  All other systems reviewed and are negative.   Physical Exam Updated Vital Signs BP 102/76   Pulse 86   Temp 97.9 F (36.6 C) (Oral)   Resp 17   Ht 5\' 2"  (1.575 m)   Wt 63.5 kg   SpO2 100%   BMI 25.61 kg/m   Physical Exam Vitals and nursing note reviewed.  Constitutional:      General: She is not in acute distress.    Appearance: She is well-developed. She is not diaphoretic.  HENT:     Head: Normocephalic and atraumatic.  Cardiovascular:     Rate and Rhythm: Normal rate and regular rhythm.     Heart sounds: No murmur heard. No friction rub. No gallop.   Pulmonary:     Effort: Pulmonary effort is normal. No respiratory distress.     Breath sounds: Normal breath sounds. No wheezing.  Abdominal:     General: Bowel sounds are normal. There is no distension.     Palpations: Abdomen is soft.     Tenderness: There is no abdominal tenderness.  Musculoskeletal:        General: No swelling or tenderness. Normal range of motion.     Cervical back: Normal range of motion and neck supple.     Right lower leg: No edema.     Left lower leg: No edema.  Skin:    General: Skin is warm and dry.  Neurological:     Mental Status: She is alert and oriented to person, place, and time.     ED Results / Procedures / Treatments   Labs (all labs ordered are listed, but only abnormal results are displayed) Labs Reviewed  COMPREHENSIVE METABOLIC PANEL  CBC WITH DIFFERENTIAL/PLATELET  TROPONIN I (HIGH SENSITIVITY)    EKG EKG Interpretation  Date/Time:  Saturday March 27 2021 01:10:46 EDT Ventricular Rate:  85 PR Interval:  181 QRS Duration: 96 QT Interval:  402 QTC Calculation: 478 R Axis:   17 Text Interpretation: Sinus rhythm Low voltage, extremity leads Abnormal T, consider ischemia, lateral leads No significant change since 01/27/2021 Confirmed by Veryl Speak [phone]) on 03/27/2021 1:31:43 AM   Radiology No results  found.  Procedures Procedures   Medications Ordered in ED Medications  sodium chloride 0.9 % bolus 500 mL (has no administration in time range)    ED Course  I have reviewed the triage vital signs and the nursing notes.  Pertinent labs & imaging results that were available during my care of the patient were reviewed by me and considered in my medical decision making (see chart for details).    MDM Rules/Calculators/A&P  Patient is a 75 year old female with  history of coronary artery disease with recent admission for MI, however no stents placed and medical management recommended.  Patient presenting with weakness and feeling lightheaded as well as nauseated.  This started this evening just prior to arrival.  She did have 1 transient episode of hypotension after taking her nitroglycerin, but has since resolved.  Patient given normal saline here in the ER.  Work-up initiated showing no acute EKG changes.  Initial troponin was 10, then repeated at 33.  This was discussed with Dr. Marcelle Smiling from cardiology.  Upon reviewing her cath report and history, we are in agreement that patient likely experienced some sort of demand.  She is not having any further symptoms and seems appropriate for discharge.  She is to follow-up with her cardiologist later this week and return if symptoms worsen.  Final Clinical Impression(s) / ED Diagnoses Final diagnoses:  None    Rx / DC Orders ED Discharge Orders    None       Veryl Speak, MD 03/27/21 [phone]

## 2021-03-27 NOTE — Discharge Instructions (Addendum)
Continue medications as previously prescribed.  Return to the ER symptoms significantly worsen or change.  Follow-up with your cardiologist later this week.

## 2021-03-27 NOTE — ED Triage Notes (Signed)
Pt bib gems c/o of a sudden onset of nausea and dizziness that started at 11:45pm. Pt describes this feeling as the same feeling she felt when she had her MI in march 2022. Pt denies CP or SOB. Pt took 3 nitros and 325 mg Asprin at home prior to EMS arrival. Pt states her cardiologist told her to come to ED if she ever had this feeling again. Initial EMS BP was 106/70 but when dropped to 80/50 en route to ED when pt started to complain of worsening nausea and feeling like she had to have a bowel movement. Pt 98% RA, HR 94.

## 2021-03-31 ENCOUNTER — Ambulatory Visit: Payer: Medicare Other | Admitting: Cardiology

## 2021-03-31 ENCOUNTER — Other Ambulatory Visit: Payer: Self-pay

## 2021-03-31 ENCOUNTER — Encounter: Payer: Self-pay | Admitting: Cardiology

## 2021-03-31 VITALS — BP 143/85 | HR 78 | Temp 98.2°F | Resp 16 | Ht 62.0 in | Wt 147.0 lb

## 2021-03-31 DIAGNOSIS — I25810 Atherosclerosis of coronary artery bypass graft(s) without angina pectoris: Secondary | ICD-10-CM

## 2021-03-31 DIAGNOSIS — I5020 Unspecified systolic (congestive) heart failure: Secondary | ICD-10-CM

## 2021-03-31 NOTE — Progress Notes (Signed)
Follow up visit  Subjective:   Jennifer Watkins, female    DOB: Nov 20, 1946, 75 y.o.   MRN: [id number]   Chief Complaint  Patient presents with  . Coronary Artery Disease  . 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  EKG showed possible old anterior septal infarct, as well as inferolateral T wave inversion.  On presentation, patient was chest pain-free.  Her troponin was elevated to 17,000.  Coronary angiogram  showed occluded high OM1, likely the culprit vessel.  No other severe stenoses were found. Echocardiogram showed global hypokinesis with severely reduced global longitudinal strain, EF 40%.  Patient has not had any recurrent chest pain or shortness of breath. She also denies orthopnea, PND, leg edema.   During the hospital admission, she was found to have large abdominal masses on physical exam, subsequently confirmed on CT scan, along with retroperitoneal adenopathy. Patient was seen by Dr. Jeral Pinch here in [place], and is now following up with Dr. Clarene Essex at [facility].  Chemotherapy was discussed with the patient, but she opted for surgery which is tentatively planned for June 2022.   Patient was recently seen in [facility] ER on 03/27/2021 after an episode of nausea and lightheadedness, without any specific complaints of chest pain or shortness of breath. Trop HS was 10-->33, was thought be due to supply demand mismatch. She has not had any recurrent symptoms of similar nature. She denies chest pain, shortness breath.   Current Outpatient Medications on File Prior to Visit  Medication Sig Dispense Refill  . aspirin 81 MG EC tablet Take 1  tablet (81 mg total) by mouth daily. Swallow whole. 90 tablet 3  . atorvastatin (LIPITOR) 80 MG tablet Take 1 tablet (80 mg total) by mouth daily at 6 PM. 90 tablet 3  . clopidogrel (PLAVIX) 75 MG tablet Take 75 mg by mouth daily.    . metoprolol succinate (TOPROL-XL) 50 MG 24 hr tablet Take 1 tablet (50 mg total) by mouth daily. Take with or immediately following a meal. 90 tablet 3  . nitroGLYCERIN (NITROSTAT) 0.4 MG SL tablet Place 1 tablet (0.4 mg total) under the tongue every 5 (five) minutes x 3 doses as needed for chest pain. 30 tablet 1  . sacubitril-valsartan (ENTRESTO) 24-26 MG Take 1 tablet by mouth 2 (two) times daily. 180 tablet 2   No current facility-administered medications on file prior to visit.    Cardiovascular & other pertient studies:  EKG 03/29/2021: Sinus rhythm Nonspecific ST-T changes inferolateral leads  EKG 01/2021: Sinus rhythm 86 bpm  Old anterior infarct Low voltage in limb leads  Diffuse nonspecific T-abnormality  Echocardiogram 01/28/2021: 1. Left ventricular ejection fraction, by estimation, is 40 to 45%. The  left ventricle has mildly decreased function. The left ventricle  demonstrates global hypokinesis. There is mild left ventricular  hypertrophy. Left ventricular diastolic parameters  are indeterminate. The average left ventricular global longitudinal strain  is -5.5 %. The global longitudinal strain is abnormal.  2. Right ventricular systolic function is normal. The right ventricular  size is normal. There is moderately elevated pulmonary artery systolic  pressure at 49 mmHg.  3. The mitral valve is grossly normal. Mild mitral valve regurgitation.  4. The aortic valve is tricuspid. Aortic valve regurgitation is not  visualized. Mild aortic valve sclerosis is present, with no evidence of  aortic valve stenosis.   Coronary angiography 01/28/2021: LM: Normal LAD: Mid diffuse 50% disease        Small D2 ostial 70% stenosis LCx: Medium sized  high OM1 with proximal 100% thrombotic occlusion        Likely culprit vessel with completed infarct RCA: Small. Mild luminal irregularities  Late presentation with completed infarct from occlusion of high OM1 vessel No benefit from revascularization given late presentation Moderate non-bstructive disease in LAD Normal LVEDP    Recent labs: 3/22/20222: Glucose 94, BUN/Cr 12/0.7. EGFR 91. Na/K 139/4.9.  Chol 150, TG 106, HDL 32, LDL 98  01/29/2021: Glucose 147, BUN/Cr 14/0.64. EGFR >60. Na/K 135/3.0. H/H 11/32. MCV 83. Platelets 207 HbA1C N/A Chol 240, TG 128, HDL 45, LDL 169 TSH N/A    Review of Systems  Cardiovascular: Negative for chest pain, dyspnea on exertion, leg swelling, palpitations and syncope.  Neurological: Positive for light-headedness (Now resolved).         Vitals:   03/31/21 1004 03/31/21 1005  BP: (!) 145/83 (!) 143/85  Pulse: 78 78  Resp:    Temp:    SpO2: 100%      Body mass index is 26.89 kg/m. Filed Weights   03/31/21 1000  Weight: 147 lb (66.7 kg)     Objective:   Physical Exam Vitals and nursing note reviewed.  Constitutional:      General: She is not in acute distress. Neck:     Vascular: No JVD.  Cardiovascular:     Rate and Rhythm: Normal rate and regular rhythm.     Heart sounds: Normal heart sounds. No murmur heard.   Pulmonary:     Effort: Pulmonary effort is normal.     Breath sounds: Normal breath sounds. No wheezing or rales.  Abdominal:     Comments: Firm b/l abdominal masses  Musculoskeletal:     Right lower leg: No edema.     Left lower leg: No edema.           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 No benefit with revascularization given late presentation. No angina symptoms.  Recent nausea, lightheadedness episode associated with mild troponin elevation likely a vasovagal episode with supply demand mismatch.  Continue Aspirin/plavix. Stop plavix 5 days before surgery, but conitnue Aspirin perioperatively.  Continue high dose statin, lipitor 80 mg. LDL 98, improved from prior. 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 Clinically euvolemic. NYHA class II Mild hyponatremia could be dilutional. Resume lasix. Continue metoprolol succinate 50 mg daily, Entresto 24-26 mg bid Unable to increase Entresto due to hypotension and AKI with the higher dose in the past  F/u in May 2022 before surgery Okay to return to work   Nigel Mormon, MD Pager: [phone] Office: [phone]

## 2021-04-08 ENCOUNTER — Other Ambulatory Visit: Payer: Self-pay

## 2021-04-08 ENCOUNTER — Ambulatory Visit: Payer: Medicare Other

## 2021-04-08 DIAGNOSIS — I5020 Unspecified systolic (congestive) heart failure: Secondary | ICD-10-CM

## 2021-04-12 ENCOUNTER — Inpatient Hospital Stay: Payer: Medicare Other | Attending: Gynecologic Oncology

## 2021-04-12 ENCOUNTER — Other Ambulatory Visit: Payer: Self-pay

## 2021-04-12 DIAGNOSIS — Z8543 Personal history of malignant neoplasm of ovary: Secondary | ICD-10-CM | POA: Insufficient documentation

## 2021-04-12 DIAGNOSIS — C569 Malignant neoplasm of unspecified ovary: Secondary | ICD-10-CM

## 2021-04-12 LAB — BASIC METABOLIC PANEL - CANCER CENTER ONLY
Anion gap: 8 (ref 5–15)
BUN: 13 mg/dL (ref 8–23)
CO2: 30 mmol/L (ref 22–32)
Calcium: 9.6 mg/dL (ref 8.9–10.3)
Chloride: 97 mmol/L — ABNORMAL LOW (ref 98–111)
Creatinine: 0.69 mg/dL (ref 0.44–1.00)
GFR, Estimated: >60 mL/min (ref >60)
Glucose, Bld: 118 mg/dL — ABNORMAL HIGH (ref 70–99)
Potassium: 3.9 mmol/L (ref 3.5–5.1)
Sodium: 135 mmol/L (ref 135–145)

## 2021-04-13 LAB — COMPREHENSIVE METABOLIC PANEL
ALT: 26 IU/L (ref 0–32)
AST: 25 IU/L (ref 0–40)
Albumin/Globulin Ratio: 1.8 (ref 1.2–2.2)
Albumin: 4.2 g/dL (ref 3.7–4.7)
Alkaline Phosphatase: 239 IU/L — ABNORMAL HIGH (ref 44–121)
BUN/Creatinine Ratio: 19 (ref 12–28)
BUN: 13 mg/dL (ref 8–27)
Bilirubin Total: 0.4 mg/dL (ref 0.0–1.2)
CO2: 26 mmol/L (ref 20–29)
Calcium: 9.6 mg/dL (ref 8.7–10.3)
Chloride: 96 mmol/L (ref 96–106)
Creatinine, Ser: 0.69 mg/dL (ref 0.57–1.00)
Globulin, Total: 2.3 g/dL (ref 1.5–4.5)
Glucose: 116 mg/dL — ABNORMAL HIGH (ref 65–99)
Potassium: 4.3 mmol/L (ref 3.5–5.2)
Sodium: 134 mmol/L (ref 134–144)
Total Protein: 6.5 g/dL (ref 6.0–8.5)
eGFR: 92 mL/min/{1.73_m2} (ref >59)

## 2021-04-13 LAB — CBC
Hematocrit: 34.8 % (ref 34.0–46.6)
Hemoglobin: 11.2 g/dL (ref 11.1–15.9)
MCH: 27.8 pg (ref 26.6–33.0)
MCHC: 32.2 g/dL (ref 31.5–35.7)
MCV: 86 fL (ref 79–97)
Platelets: 198 10*3/uL (ref 150–450)
RBC: 4.03 x10E6/uL (ref 3.77–5.28)
RDW: 13.2 % (ref 11.7–15.4)
WBC: 4 10*3/uL (ref 3.4–10.8)

## 2021-04-13 LAB — PRO B NATRIURETIC PEPTIDE: NT-Pro BNP: 1725 pg/mL — ABNORMAL HIGH (ref 0–301)

## 2021-04-16 ENCOUNTER — Other Ambulatory Visit: Payer: Self-pay

## 2021-04-16 ENCOUNTER — Encounter: Payer: Self-pay | Admitting: Cardiology

## 2021-04-16 ENCOUNTER — Ambulatory Visit: Payer: Self-pay | Admitting: Cardiology

## 2021-04-16 VITALS — BP 150/89 | HR 79 | Temp 97.8°F | Ht 62.0 in | Wt 143.0 lb

## 2021-04-16 DIAGNOSIS — I5020 Unspecified systolic (congestive) heart failure: Secondary | ICD-10-CM

## 2021-04-16 DIAGNOSIS — Z01810 Encounter for preprocedural cardiovascular examination: Secondary | ICD-10-CM

## 2021-04-16 DIAGNOSIS — I25810 Atherosclerosis of coronary artery bypass graft(s) without angina pectoris: Secondary | ICD-10-CM

## 2021-04-16 NOTE — Progress Notes (Signed)
Follow up visit  Subjective:   Jennifer Watkins, female    DOB: [birth year], 75 y.o.   MRN: [id number]   Chief Complaint  Patient presents with  . Coronary Artery Disease  . 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  Patient is scheduled to undergo surgery granulosa cell tumor surgery on June 2. She is in good spirits. She denies chest pain. She is walking/swimmin up to 40 min without overt dyspnea. Leg edema has resolved. She also denies orthopnea, PND.  Recent echocardiogram and lab results discussed with the patient, details below.  Patient was recently seen in [facility] ER on 03/27/2021 after an episode of nausea and lightheadedness, without any specific complaints of chest pain or shortness of breath. Trop HS was 10-->33, was thought be due to supply demand mismatch. She has not had any recurrent symptoms of similar nature. She denies chest pain, shortness breath.   Current Outpatient Medications on File Prior to Visit  Medication Sig Dispense Refill  . aspirin 81 MG EC tablet Take 1 tablet (81 mg total) by mouth daily. Swallow whole. 90 tablet 3  . atorvastatin (LIPITOR) 80 MG tablet Take 1 tablet (80 mg total) by mouth daily at 6 PM. 90 tablet 3  . clopidogrel (PLAVIX) 75 MG tablet Take 75 mg by mouth daily.    . furosemide (LASIX) 40 MG tablet Take 40 mg by mouth.    . metoprolol succinate (TOPROL-XL) 50 MG 24 hr tablet Take 1 tablet (50 mg total) by mouth daily. Take with or immediately following a meal. 90 tablet 3  . nitroGLYCERIN (NITROSTAT) 0.4 MG SL tablet Place 1 tablet (0.4 mg total) under the tongue every 5 (five) minutes x 3 doses as needed for chest pain. 30 tablet 1  . sacubitril-valsartan (ENTRESTO) 24-26 MG Take 1  tablet by mouth 2 (two) times daily. 180 tablet 2   No current facility-administered medications on file prior to visit.    Cardiovascular & other pertient studies:  Echocardiogram 04/08/2021:  Moderately depressed LV systolic function with visual EF 35-40%. Left  ventricle cavity is normal in size. Doppler evidence of grade II  (pseudonormal) diastolic dysfunction, normal LAP. Left ventricle regional  wall motion findings: Basal anterolateral, Mid anterolateral and Apical  lateral hypokinesis. Basal inferolateral and Mid inferolateral akinesis.  Left atrial cavity is mildly dilated.  Mild (Grade I) aortic regurgitation.  Mild (Grade I) mitral regurgitation.  Mild tricuspid regurgitation. Mild pulmonary hypertension. RVSP measures  37 mmHg.  Prior study dated 01/28/2021: LVEF 40-45%, global hypokinesis, mild LVH, GLS  -5.5%, PASP 49 mmHg, mild MR.   EKG 03/29/2021: Sinus rhythm Nonspecific ST-T changes inferolateral leads   Coronary angiography 01/28/2021: LM: Normal LAD: Mid diffuse 50% disease        Small D2 ostial 70% stenosis LCx: Medium sized high OM1 with proximal 100% thrombotic occlusion        Likely culprit vessel with completed infarct RCA: Small. Mild luminal irregularities  Late presentation with completed infarct from occlusion of high OM1 vessel No benefit from revascularization given late presentation Moderate non-bstructive disease in LAD Normal LVEDP   Recent labs: 04/12/2021: Glucose 116, BUN/Cr 13/0.69. EGFR >60. Na/K 134/4.3. AlKP 239. Rest of the CMP normal NT pro BNP 1725 (0-301) H/H 11/34. MCV 86. Platelets 198   3/22/20222: Glucose  94, BUN/Cr 12/0.7. EGFR 91. Na/K 139/4.9.  Chol 150, TG 106, HDL 32, LDL 98  01/29/2021: Glucose 147, BUN/Cr 14/0.64. EGFR >60. Na/K 135/3.0. H/H 11/32. MCV 83. Platelets 207 HbA1C N/A Chol 240, TG 128, HDL 45, LDL 169 TSH N/A    Review of Systems  Cardiovascular: Negative for chest pain, dyspnea on exertion,  leg swelling, palpitations and syncope.  Neurological: Positive for light-headedness (Now resolved).         Vitals:   04/16/21 1142 04/16/21 1146  BP: (!) 150/100 (!) 150/89  Pulse: 85 79  Temp: 97.8 F (36.6 C)   SpO2: 98% 97%     Body mass index is 26.16 kg/m. Filed Weights   04/16/21 1142  Weight: 143 lb (64.9 kg)     Objective:   Physical Exam Vitals and nursing note reviewed.  Constitutional:      General: She is not in acute distress. Neck:     Vascular: No JVD.  Cardiovascular:     Rate and Rhythm: Normal rate and regular rhythm.     Heart sounds: Normal heart sounds. No murmur heard.   Pulmonary:     Effort: Pulmonary effort is normal.     Breath sounds: Normal breath sounds. No wheezing or rales.  Abdominal:     Comments: Firm b/l abdominal masses  Musculoskeletal:     Right lower leg: No edema.     Left lower leg: No edema.           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  CAD without angina: Late presentation MI with high OM1 occlusion (01/2019). No benefit with revascularization given late presentation. No current angina symptoms.  Continue Aspirin/plavix. Stop plavix (and Aspirin, if preferred by surgeons), 5 days before surgery. Continue high dose statin, lipitor 80 mg. LDL 98, improved from prior. Continue metoprolol succinate 50 mg  HFrEF: EF 35-40%. Ischemic cardiomyopathy. NYHA class I. Her NT pro BNP is elevated, but clinically appears euvolumic.  Mild hyponatremia likely dilutional.  She is reluctant to increase the dose of Entresto, owing to hypotension, dizziness, and mild increase in Cr (0.7 to 1.1) she experienced with Entresto 49-51 mg bid in the past.  Also not too keen on starting spironolactone. Recommend continue metoprolol succinate 50 mg, ENtresto 24-26 mg bid. Continue lasix 40 mg daily, with additional dose, as needed, for days with leg edema.  Pre-op risk stratification: Patient is aware that her perioperative cardiac risk is elevated, owing to recent MI and HFrEF> However, she is well optimized from cardiac standpoint. Also, delaying surgery further is not a good option either. Knowing the risks and benefits, I feel it is reasonable to proceed with the surgery on June 2, as scheduled.     Nigel Mormon, MD Pager: [phone] Office: [phone]

## 2021-04-19 ENCOUNTER — Other Ambulatory Visit: Payer: Self-pay

## 2021-04-19 ENCOUNTER — Ambulatory Visit (HOSPITAL_COMMUNITY)
Admission: RE | Admit: 2021-04-19 | Discharge: 2021-04-19 | Disposition: A | Discharge status: Home / Self Care | Payer: Medicare Other | Source: Ambulatory Visit | Attending: Gynecologic Oncology | Admitting: Gynecologic Oncology

## 2021-04-19 DIAGNOSIS — C569 Malignant neoplasm of unspecified ovary: Secondary | ICD-10-CM | POA: Diagnosis present

## 2021-04-19 DIAGNOSIS — C577 Malignant neoplasm of other specified female genital organs: Secondary | ICD-10-CM | POA: Diagnosis present

## 2021-04-19 MED ORDER — SODIUM CHLORIDE (PF) 0.9 % IJ SOLN
INTRAMUSCULAR | Status: AC
  Filled 2021-04-19: qty 50

## 2021-04-19 MED ORDER — IOHEXOL 300 MG/ML  SOLN
75.0000 mL | Freq: Once | INTRAMUSCULAR | Status: AC | PRN
  Administered 2021-04-19: 75 mL via INTRAVENOUS

## 2021-04-27 ENCOUNTER — Other Ambulatory Visit (HOSPITAL_COMMUNITY): Payer: Medicare Other

## 2021-04-30 NOTE — Telephone Encounter (Signed)
From patient.

## 2021-05-07 ENCOUNTER — Other Ambulatory Visit: Payer: Medicare Other

## 2021-05-07 ENCOUNTER — Ambulatory Visit (HOSPITAL_COMMUNITY): Payer: Medicare Other

## 2021-05-11 ENCOUNTER — Ambulatory Visit: Payer: Medicare Other | Admitting: Gynecologic Oncology

## 2021-05-12 ENCOUNTER — Telehealth: Payer: Self-pay

## 2021-05-12 NOTE — Telephone Encounter (Signed)
Pt called requesting that she speak to you. The cancer doctor is trying to treat her and she sent a message on mychart to you. She has some questions about some hormonal treatments. She researched a bit herself but her doctor would like your opinion. Pt phone number [phone]

## 2021-05-12 NOTE — Telephone Encounter (Signed)
Addressed. Note below for documentation. Staff, please forward my note to Dr. Clarene Essex at [facility]  Patient's surgery for granulosa cell tumor was considered as surgical risks were felt prohibitive.  This was primarily due to location of her tumor is close to vascular structures.  She was given option of leuprolide injections.  Patient wanted my opinion from a cardiovascular risk standpoint.  In my brief research, I found that this medication was originally used for prostate cancer in men.  The data I found was in the setting of men with prostate cancer.  There is increased risk of acute myocardial infarction, sudden death, stroke in patients who do have underlying cardiovascular disease.  The numbers are from were in the range of around 8-10 events per 100 person-year.  Certainly, either the surgery or the provider is not going to be without cardiovascular risks.  I discussed with the patient that the final decision depends on what patient's goals and wishes are.  If the goals are to eradicate/reduce the tumor then we have to be the cardiovascular risks against it.  If the goal is to be more conservative about edema management and avoid any additional cardiovascular risks, admission consider not treating.  I do not know what the prognosis of the tumor is without any treatment.  I encouraged the patient to discuss this with her oncology team.  Finally, I am available for any discussion with oncology team personally.  My office number is [phone].  ? Nigel Mormon, MD Pager: [phone] Office: [phone]

## 2021-05-27 ENCOUNTER — Other Ambulatory Visit: Payer: Self-pay | Admitting: Cardiology

## 2021-05-27 DIAGNOSIS — I25810 Atherosclerosis of coronary artery bypass graft(s) without angina pectoris: Secondary | ICD-10-CM

## 2021-05-28 ENCOUNTER — Other Ambulatory Visit: Payer: Self-pay

## 2021-05-28 ENCOUNTER — Ambulatory Visit: Payer: Medicare Other | Admitting: Cardiology

## 2021-05-28 ENCOUNTER — Encounter: Payer: Self-pay | Admitting: Cardiology

## 2021-05-28 VITALS — BP 169/91 | HR 85 | Temp 97.7°F | Resp 16 | Ht 62.0 in | Wt 151.0 lb

## 2021-05-28 DIAGNOSIS — I25810 Atherosclerosis of coronary artery bypass graft(s) without angina pectoris: Secondary | ICD-10-CM

## 2021-05-28 DIAGNOSIS — I5020 Unspecified systolic (congestive) heart failure: Secondary | ICD-10-CM

## 2021-05-28 NOTE — Progress Notes (Signed)
Follow up visit  Subjective:   Jennifer Watkins, female    DOB: [birth year], 75 y.o.   MRN: [id number]   Chief Complaint  Patient presents with   Coronary Artery Disease   Follow-up    75 y.o. 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  Tel encounter 05/12/2021: Patient's surgery for granulosa cell tumor was considered as surgical risks were felt prohibitive.  This was primarily due to location of her tumor is close to vascular structures.  She was given option of leuprolide injections.  Patient wanted my opinion from a cardiovascular risk standpoint.   In my brief research, I found that this medication was originally used for prostate cancer in men.  The data I found was in the setting of men with prostate cancer.  There is increased risk of acute myocardial infarction, sudden death, stroke in patients who do have underlying cardiovascular disease.  The numbers are from were in the range of around 8-10 events per 100 person-year.   Certainly, either the surgery or the provider is not going to be without cardiovascular risks.  I discussed with the patient that the final decision depends on what patient's goals and wishes are.  If the goals are to eradicate/reduce the tumor then we have to be the cardiovascular risks against it.  If the goal is to be more conservative about edema management and avoid any additional cardiovascular risks, admission consider not treating.  I do not know what the prognosis of the tumor is without any treatment.  I encouraged the patient to discuss this with her oncology team.   Patient has started [place] therapy as alternate therapy. She denies chest pain or shortness of breath. She has started swimming again.    Current Outpatient  Medications on File Prior to Visit  Medication Sig Dispense Refill   aspirin 81 MG EC tablet Take 1 tablet (81 mg total) by mouth daily. Swallow whole. 90 tablet 3   atorvastatin (LIPITOR) 80 MG tablet Take 1 tablet (80 mg total) by mouth daily at 6 PM. 90 tablet 3   clopidogrel (PLAVIX) 75 MG tablet Take 75 mg by mouth daily.     furosemide (LASIX) 40 MG tablet Take 40 mg by mouth.     metoprolol succinate (TOPROL-XL) 50 MG 24 hr tablet Take 1 tablet (50 mg total) by mouth daily. Take with or immediately following a meal. 90 tablet 3   nitroGLYCERIN (NITROSTAT) 0.4 MG SL tablet Place 1 tablet (0.4 mg total) under the tongue every 5 (five) minutes x 3 doses as needed for chest pain. 30 tablet 1   sacubitril-valsartan (ENTRESTO) 24-26 MG Take 1 tablet by mouth 2 (two) times daily. 180 tablet 2   No current facility-administered medications on file prior to visit.    Cardiovascular & other pertient studies:  Echocardiogram 04/08/2021:  Moderately depressed LV systolic function with visual EF 35-40%. Left  ventricle cavity is normal in size. Doppler evidence of grade II  (pseudonormal) diastolic dysfunction, normal LAP. Left ventricle regional  wall motion findings: Basal anterolateral, Mid anterolateral and Apical  lateral hypokinesis. Basal inferolateral and Mid inferolateral akinesis.  Left atrial cavity is mildly dilated.  Mild (Grade I) aortic regurgitation.  Mild (Grade I) mitral regurgitation.  Mild tricuspid regurgitation. Mild pulmonary hypertension. RVSP measures  37 mmHg.  Prior study  dated 01/28/2021: LVEF 40-45%, global hypokinesis, mild LVH, GLS  -5.5%, PASP 49 mmHg, mild MR.   EKG 03/29/2021: Sinus rhythm Nonspecific ST-T changes inferolateral leads   Coronary angiography 01/28/2021: LM: Normal LAD: Mid diffuse 50% disease        Small D2 ostial 70% stenosis LCx: Medium sized high OM1 with proximal 100% thrombotic occlusion        Likely culprit vessel with completed  infarct RCA: Small. Mild luminal irregularities   Late presentation with completed infarct from occlusion of high OM1 vessel No benefit from revascularization given late presentation Moderate non-bstructive disease in LAD Normal LVEDP    Recent labs: 04/12/2021: Glucose 116, BUN/Cr 13/0.69. EGFR >60. Na/K 134/4.3. AlKP 239. Rest of the CMP normal NT pro BNP 1725 (0-301) H/H 11/34. MCV 86. Platelets 198   3/22/20222: Glucose 94, BUN/Cr 12/0.7. EGFR 91. Na/K 139/4.9.  Chol 150, TG 106, HDL 32, LDL 98  01/29/2021: Glucose 147, BUN/Cr 14/0.64. EGFR >60. Na/K 135/3.0. H/H 11/32. MCV 83. Platelets 207 HbA1C N/A Chol 240, TG 128, HDL 45, LDL 169 TSH N/A    Review of Systems  Cardiovascular:  Negative for chest pain, dyspnea on exertion, leg swelling, palpitations and syncope.  Neurological:  Positive for light-headedness (Now resolved).        Vitals:   05/28/21 1142 05/28/21 1143  BP: (!) 158/85 (!) 169/91  Pulse: 85 85  Resp: 16   Temp: 97.7 F (36.5 C)   SpO2: 98%      Body mass index is 27.62 kg/m. Filed Weights   05/28/21 1142  Weight: 151 lb (68.5 kg)     Objective:   Physical Exam Vitals and nursing note reviewed.  Constitutional:      General: She is not in acute distress. Neck:     Vascular: No JVD.  Cardiovascular:     Rate and Rhythm: Normal rate and regular rhythm.     Heart sounds: Normal heart sounds. No murmur heard. Pulmonary:     Effort: Pulmonary effort is normal.     Breath sounds: Normal breath sounds. No wheezing or rales.  Abdominal:     Comments: Firm b/l abdominal masses  Musculoskeletal:     Right lower leg: Edema (1+) present.     Left lower leg: Edema (1+) present.          Assessment & Recommendations:   75 y.o. 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  CAD without angina: Late presentation MI with high OM1 occlusion (01/2019). No benefit with revascularization given late presentation. No current angina symptoms.  Continue Aspirin/plavix, lipitor 80 mg, metoprolol succinate 50 mg  HFrEF: EF 35-40%. Ischemic cardiomyopathy. NYHA class I. She is mildly volume overloaded, but does not want start additional therapy, such as spironolactone, at this time. Continue metoprolol succinate 50 mg, Entresto 24-26 mg bid. Continue lasix 40 mg daily, with additional dose, as needed, for days with leg edema.  F/u in 6 weeks   Nigel Mormon, MD Pager: [phone] Office: [phone]

## 2021-06-07 NOTE — Telephone Encounter (Signed)
From pt

## 2021-06-15 ENCOUNTER — Encounter (HOSPITAL_BASED_OUTPATIENT_CLINIC_OR_DEPARTMENT_OTHER): Payer: Self-pay

## 2021-06-15 ENCOUNTER — Emergency Department (HOSPITAL_BASED_OUTPATIENT_CLINIC_OR_DEPARTMENT_OTHER)
Admission: EM | Admit: 2021-06-15 | Discharge: 2021-06-16 | Disposition: A | Discharge status: Home / Self Care | Payer: Medicare Other | Attending: Emergency Medicine | Admitting: Emergency Medicine

## 2021-06-15 ENCOUNTER — Other Ambulatory Visit: Payer: Self-pay

## 2021-06-15 DIAGNOSIS — Z96649 Presence of unspecified artificial hip joint: Secondary | ICD-10-CM | POA: Insufficient documentation

## 2021-06-15 DIAGNOSIS — I5020 Unspecified systolic (congestive) heart failure: Secondary | ICD-10-CM | POA: Insufficient documentation

## 2021-06-15 DIAGNOSIS — Z951 Presence of aortocoronary bypass graft: Secondary | ICD-10-CM | POA: Diagnosis not present

## 2021-06-15 DIAGNOSIS — Z8543 Personal history of malignant neoplasm of ovary: Secondary | ICD-10-CM | POA: Insufficient documentation

## 2021-06-15 DIAGNOSIS — I2510 Atherosclerotic heart disease of native coronary artery without angina pectoris: Secondary | ICD-10-CM | POA: Insufficient documentation

## 2021-06-15 DIAGNOSIS — R1032 Left lower quadrant pain: Secondary | ICD-10-CM | POA: Diagnosis not present

## 2021-06-15 DIAGNOSIS — R102 Pelvic and perineal pain: Secondary | ICD-10-CM | POA: Insufficient documentation

## 2021-06-15 LAB — CBC
HCT: 30.9 % — ABNORMAL LOW (ref 36.0–46.0)
Hemoglobin: 10.1 g/dL — ABNORMAL LOW (ref 12.0–15.0)
MCH: 27.4 pg (ref 26.0–34.0)
MCHC: 32.7 g/dL (ref 30.0–36.0)
MCV: 84 fL (ref 80.0–100.0)
Platelets: 147 10*3/uL — ABNORMAL LOW (ref 150–400)
RBC: 3.68 MIL/uL — ABNORMAL LOW (ref 3.87–5.11)
RDW: 13.9 % (ref 11.5–15.5)
WBC: 6.4 10*3/uL (ref 4.0–10.5)
nRBC: 0 % (ref 0.0–0.2)

## 2021-06-15 LAB — COMPREHENSIVE METABOLIC PANEL
ALT: 16 U/L (ref 0–44)
AST: 18 U/L (ref 15–41)
Albumin: 4 g/dL (ref 3.5–5.0)
Alkaline Phosphatase: 79 U/L (ref 38–126)
Anion gap: 10 (ref 5–15)
BUN: 11 mg/dL (ref 8–23)
CO2: 25 mmol/L (ref 22–32)
Calcium: 9.1 mg/dL (ref 8.9–10.3)
Chloride: 100 mmol/L (ref 98–111)
Creatinine, Ser: 0.52 mg/dL (ref 0.44–1.00)
GFR, Estimated: >60 mL/min (ref >60)
Glucose, Bld: 166 mg/dL — ABNORMAL HIGH (ref 70–99)
Potassium: 3.6 mmol/L (ref 3.5–5.1)
Sodium: 135 mmol/L (ref 135–145)
Total Bilirubin: 0.5 mg/dL (ref 0.3–1.2)
Total Protein: 6.4 g/dL — ABNORMAL LOW (ref 6.5–8.1)

## 2021-06-15 MED ORDER — HYDROCODONE-ACETAMINOPHEN 5-325 MG PO TABS: 1.0000 | ORAL_TABLET | Freq: Four times a day (QID) | ORAL | 0 refills | Status: DC | PRN

## 2021-06-15 MED ORDER — FENTANYL CITRATE (PF) 100 MCG/2ML IJ SOLN
50.0000 ug | INTRAMUSCULAR | Status: DC | PRN
Start: 2021-06-15 — End: 2021-06-16
  Administered 2021-06-15: 50 ug via INTRAVENOUS
  Filled 2021-06-15: qty 2

## 2021-06-15 MED ORDER — SENNOSIDES-DOCUSATE SODIUM 8.6-50 MG PO TABS: 1.0000 | ORAL_TABLET | Freq: Every evening | ORAL | 0 refills | Status: AC | PRN

## 2021-06-15 MED ORDER — HYDROCODONE-ACETAMINOPHEN 5-325 MG PO TABS
1.0000 | ORAL_TABLET | Freq: Once | ORAL | Status: AC
  Administered 2021-06-15: 1 via ORAL
  Filled 2021-06-15: qty 1

## 2021-06-15 MED ORDER — ONDANSETRON HCL 4 MG/2ML IJ SOLN
4.0000 mg | Freq: Once | INTRAMUSCULAR | Status: AC
  Administered 2021-06-15: 4 mg via INTRAVENOUS
  Filled 2021-06-15: qty 2

## 2021-06-15 NOTE — ED Triage Notes (Signed)
Pt presents with sudden onset of L sided pelvic pain. Pt states she was dx with ovarian CA on 3/2. Pt states this is the first bout of severe pain she has had. Pt states she is on palliative care and taking homeopathic treatments. Pt here today for pain control.

## 2021-06-15 NOTE — ED Notes (Signed)
Patient transported to X-ray 

## 2021-06-15 NOTE — Discharge Instructions (Addendum)
You were seen in the emergency department today with pelvic pain.  We discussed obtaining further imaging but you would prefer to discuss further with your oncology team.  I have placed an order for pain medication to be filled to your pharmacy tomorrow.  I have also placed an order for case management team to call you to set up home health.  Return to the emergency department any new or suddenly worsening symptoms.

## 2021-06-15 NOTE — ED Provider Notes (Signed)
Emergency Department Provider Note   I have reviewed the triage vital signs and the nursing notes.   HISTORY  Chief Complaint Pelvic Pain   HPI Jennifer Watkins is a 75 y.o. female with past medical history reviewed below including metastatic ovarian cancer presents to the emergency department with pain in her lower abdomen.  She states this feels similar to her prior ovarian cancer pain although for the past several months she has been relatively pain-free and high energy.  She tells me that she has had very frank discussions with her oncology team recently and that palliative care is being discussed very seriously.  She states that her main goal is to reduce pain symptoms and improve her quality of life.  She is not having any urinary tract infection symptoms.  No chest pain or shortness of breath symptoms.  She states that in addition to worsening pain in her abdomen does feel somewhat more distended.  She is having bowel movements.  She denies vomiting.  She tells me that she does not have a primary care doctor and is interested in discussing palliative care and physical therapy type options to help with her symptoms.  She is not interested in pursuing additional imaging or other acute interventions today although these were offered.  Past Medical History:  Diagnosis Date   Hyperlipidemia    Myocardial infarction ([place]) 0302/2022   Ovarian cancer [facility])    ovarian    Patient Active Problem List   Diagnosis Date Noted   Preop cardiovascular exam 02/17/2021   Coronary artery disease involving coronary bypass graft of native heart without angina pectoris 02/02/2021   HFrEF (heart failure with reduced ejection fraction) ([place]) 02/02/2021   Granulosa cell carcinoma ([place]) 02/01/2021   ACS (acute coronary syndrome) ([place]) 01/28/2021   NSTEMI (non-ST elevated myocardial infarction) [facility])     Past Surgical History:  Procedure Laterality Date   LEFT HEART CATH AND CORONARY ANGIOGRAPHY N/A  01/28/2021   Procedure: LEFT HEART CATH AND CORONARY ANGIOGRAPHY;  Surgeon: Nigel Mormon, MD;  Location: [place] [facility];  Service: Cardiovascular;  Laterality: N/A;   TOTAL ABDOMINAL HYSTERECTOMY W/ BILATERAL SALPINGOOPHORECTOMY     TOTAL HIP ARTHROPLASTY      Allergies Patient has no known allergies.  Family History  Problem Relation Age of Onset   Heart Problems Mother    Breast cancer Mother        dx after age 89   Heart Problems Father    Non-Hodgkin's lymphoma Father    Prostate cancer Brother        dx after age 11; no mets   Leukemia Sister 67   Ovarian cancer Neg Hx    Uterine cancer Neg Hx     Social History Social History   Tobacco Use   Smoking status: Never   Smokeless tobacco: Never  Vaping Use   Vaping Use: Never used  Substance Use Topics   Alcohol use: Not Currently   Drug use: Never    Review of Systems  Constitutional: No fever/chills Eyes: No visual changes. ENT: No sore throat. Cardiovascular: Denies chest pain. Respiratory: Denies shortness of breath. Gastrointestinal: Positive abdominal pain.  No nausea, no vomiting.  No diarrhea.  No constipation. Genitourinary: Negative for dysuria. Musculoskeletal: Negative for back pain. Skin: Negative for rash. Neurological: Negative for headaches, focal weakness or  10-point ROS otherwise negative.  ____________________________________________   PHYSICAL EXAM:  VITAL SIGNS: ED Triage Vitals  Enc Vitals Group  BP 06/15/21 1944 139/87     Pulse Rate 06/15/21 1944 78     Resp 06/15/21 1944 20     Temp 06/15/21 1944 98.2 F (36.8 C)     Temp Source 06/15/21 1944 Oral     SpO2 06/15/21 1944 94 %     Weight 06/15/21 1945 140 lb (63.5 kg)     Height 06/15/21 1945 5\' 2"  (1.575 m)    Constitutional: Alert and oriented. Well appearing and in no acute distress. Eyes: Conjunctivae are normal.  Head: Atraumatic. Nose: No congestion/rhinnorhea. Mouth/Throat: Mucous membranes are  moist.  Neck: No stridor.  Cardiovascular: Normal rate, regular rhythm. Good peripheral circulation. Grossly normal heart sounds.   Respiratory: Normal respiratory effort.  No retractions. Lungs CTAB. Gastrointestinal: Soft with mild LLQ tenderness but no peritoneal exam findings. Palpable mass in the left mid abdomen. Positive distention but not tense. No fluid wave.  Musculoskeletal: No lower extremity tenderness nor edema. No gross deformities of extremities. Neurologic:  Normal speech and language. No gross focal neurologic deficits are appreciated.  Skin:  Skin is warm, dry and intact. No rash noted.   ____________________________________________   LABS (all labs ordered are listed, but only abnormal results are displayed)  Labs Reviewed  CBC - Abnormal; Notable for the following components:      Result Value   RBC 3.68 (*)    Hemoglobin 10.1 (*)    HCT 30.9 (*)    Platelets 147 (*)    All other components within normal limits  COMPREHENSIVE METABOLIC PANEL - Abnormal; Notable for the following components:   Glucose, Bld 166 (*)    Total Protein 6.4 (*)    All other components within normal limits   ____________________________________________  RADIOLOGY  None  ____________________________________________   PROCEDURES  Procedure(s) performed:   Procedures  None ____________________________________________   INITIAL IMPRESSION / ASSESSMENT AND PLAN / ED COURSE  Pertinent labs & imaging results that were available during my care of the patient were reviewed by me and considered in my medical decision making (see chart for details).   Patient presents with lower abdominal pain worse on the left similar to prior cancer pain but worse today.  Has been relatively pain-free in the past several months.  Has had improvement in the past with Vicodin.  She does not have a primary care doctor to help facilitate palliative type care or pain management from home.  She does  have a plan to reach out to her oncology team although they are at [facility].  We discussed goals of care and management options from the emergency department.  I discussed that CT imaging could be helpful to rule out bowel obstruction, hydroureter, or other change in her cancer burden to explain pain symptoms.  Patient states she is not interested in additional CT scans and understands that we could be missing/delaying these types of diagnoses.  Her main goal is to return home with pain medication.  I did place a case management consult along with face-to-face for home health/nursing/social work.  I reviewed the [place] drug database and did prescribe some Vicodin medication although [facility]-term prescribing is limited from the emergency department.  Patient understands this.    ____________________________________________  FINAL CLINICAL IMPRESSION(S) / ED DIAGNOSES  Final diagnoses:  Pelvic pain     MEDICATIONS GIVEN DURING THIS VISIT:  Medications  fentaNYL (SUBLIMAZE) injection 50 mcg (50 mcg Intravenous Given 06/15/21 2001)  ondansetron (ZOFRAN) injection 4 mg (4 mg Intravenous Given 06/15/21  2000)  HYDROcodone-acetaminophen (NORCO/VICODIN) 5-325 MG per tablet 1 tablet (1 tablet Oral Given 06/15/21 2353)     NEW OUTPATIENT MEDICATIONS STARTED DURING THIS VISIT:  Discharge Medication List as of 06/15/2021 11:55 PM     START taking these medications   Details  HYDROcodone-acetaminophen (NORCO/VICODIN) 5-325 MG tablet Take 1 tablet by mouth every 6 (six) hours as needed for severe pain., Starting Tue 06/15/2021, Normal    senna-docusate (SENOKOT-S) 8.6-50 MG tablet Take 1 tablet by mouth at bedtime as needed for mild constipation or moderate constipation., Starting Tue 06/15/2021, Normal        Note:  This document was prepared using Dragon voice recognition software and may include unintentional dictation errors.  Nanda Quinton, MD, [facility] Emergency Medicine    Kareem Cathey, [facility],  MD 06/16/21 [phone]

## 2021-06-16 ENCOUNTER — Ambulatory Visit: Payer: Self-pay | Admitting: *Deleted

## 2021-06-16 NOTE — Telephone Encounter (Signed)
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 No PCP. Pt was told to call [facility] to establish care and speak with SW regarding palliative care. Pt states "I need to get this started, I don't have much time left." Advised to call oncologist; pt states she did  and left message today. Advised oncologist best resource, NT did call [facility], extended ring. Assured pt NT would route to practice for review. Reiterated to call oncologist. Pt hung up before any additional care advise could be provided.  CB# [phone] Reason for Disposition  [1] Caller requesting NON-URGENT health information AND [2] PCP's office is the best resource  Answer Assessment - Initial Assessment Questions 1. REASON FOR CALL or QUESTION: "What is your reason for calling today?" or "How can I best help you?" or "What question do you have that I can help answer?"     Requesting to initiate Palliative care.  Protocols used: Information Only Call - No Triage-A-AH

## 2021-06-18 ENCOUNTER — Telehealth: Payer: Self-pay

## 2021-06-18 ENCOUNTER — Encounter: Payer: Self-pay | Admitting: Hematology and Oncology

## 2021-06-18 NOTE — Telephone Encounter (Signed)
Attempted to contact patient to schedule a Palliative Care consult appointment. No answer left a message to return call. Mychart message sent also.

## 2021-06-21 ENCOUNTER — Telehealth: Payer: Self-pay

## 2021-06-21 NOTE — Telephone Encounter (Signed)
Spoke with patient and scheduled an in-person Palliative Consult for 07/14/21 @ 8:30AM.   COVID screening was negative. No pets in home. Patient lives with alone.   Consent obtained; updated Outlook/Netsmart/Team List and Epic.   Patient is aware she may be receiving a call from provider the day before or day of to confirm appointment.

## 2021-07-06 ENCOUNTER — Other Ambulatory Visit (HOSPITAL_COMMUNITY): Payer: Self-pay | Admitting: Cardiology

## 2021-07-07 LAB — LIPID PANEL
Chol/HDL Ratio: 3.9 ratio (ref 0.0–4.4)
Cholesterol, Total: 142 mg/dL (ref 100–199)
HDL: 36 mg/dL — ABNORMAL LOW
LDL Chol Calc (NIH): 84 mg/dL (ref 0–99)
Triglycerides: 119 mg/dL (ref 0–149)
VLDL Cholesterol Cal: 22 mg/dL (ref 5–40)

## 2021-07-12 ENCOUNTER — Other Ambulatory Visit: Payer: Self-pay

## 2021-07-12 ENCOUNTER — Ambulatory Visit: Payer: Medicare Other | Admitting: Cardiology

## 2021-07-12 ENCOUNTER — Encounter: Payer: Self-pay | Admitting: Cardiology

## 2021-07-12 VITALS — BP 145/95 | HR 85 | Temp 98.0°F | Resp 16 | Ht 63.0 in | Wt 146.0 lb

## 2021-07-12 DIAGNOSIS — I5020 Unspecified systolic (congestive) heart failure: Secondary | ICD-10-CM

## 2021-07-12 DIAGNOSIS — I25810 Atherosclerosis of coronary artery bypass graft(s) without angina pectoris: Secondary | ICD-10-CM

## 2021-07-12 NOTE — Progress Notes (Signed)
Follow up visit  Subjective:   Jennifer Watkins, female    DOB: 1946-06-04, 75 y.o.   MRN: [id number]   Chief Complaint  Patient presents with   Coronary artery disease involving coronary bypass graft of    Follow-up    75 y.o. 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  She is not a candidate for any surgical therapy.  From cardiac standpoint, she is doing well without any complaints of chest pain or shortness of breath.  She has stable unchanged mild pedal edema.  Few days ago, patient was seen in ER due to left lower abdominal pain, and was treated with medications.  She has regular follow-up with her OB/GYN oncologist Dr. Clarene Essex at [facility].  Patient is also under follow-up from a palliative care physician.  She is contemplating her CODE STATUS wishes, and leaning towards DNR.  Current Outpatient Medications on File Prior to Visit  Medication Sig Dispense Refill   ASPIRIN LOW DOSE 81 MG EC tablet TAKE 1 TABLET(81 MG) BY MOUTH DAILY. SWALLOW WHOLE 90 tablet 3   atorvastatin (LIPITOR) 80 MG tablet Take 1 tablet (80 mg total) by mouth daily at 6 PM. 90 tablet 3   clopidogrel (PLAVIX) 75 MG tablet Take 75 mg by mouth daily.     furosemide (LASIX) 40 MG tablet Take 40 mg by mouth.     HYDROcodone-acetaminophen (NORCO/VICODIN) 5-325 MG tablet Take 1 tablet by mouth every 6 (six) hours as needed for severe pain. 15 tablet 0   metoprolol succinate (TOPROL-XL) 50 MG 24 hr tablet Take 1 tablet (50 mg total) by mouth daily. Take with or immediately following a meal. 90 tablet 3   nitroGLYCERIN (NITROSTAT) 0.4 MG SL tablet Place 1 tablet (0.4 mg total) under the  tongue every 5 (five) minutes x 3 doses as needed for chest pain. 30 tablet 1   sacubitril-valsartan (ENTRESTO) 24-26 MG Take 1 tablet by mouth 2 (two) times daily. 180 tablet 2   senna-docusate (SENOKOT-S) 8.6-50 MG tablet Take 1 tablet by mouth at bedtime as needed for mild constipation or moderate constipation. 30 tablet 0   No current facility-administered medications on file prior to visit.    Cardiovascular & other pertient studies:  Echocardiogram 04/08/2021:  Moderately depressed LV systolic function with visual EF 35-40%. Left  ventricle cavity is normal in size. Doppler evidence of grade II  (pseudonormal) diastolic dysfunction, normal LAP. Left ventricle regional  wall motion findings: Basal anterolateral, Mid anterolateral and Apical  lateral hypokinesis. Basal inferolateral and Mid inferolateral akinesis.  Left atrial cavity is mildly dilated.  Mild (Grade I) aortic regurgitation.  Mild (Grade I) mitral regurgitation.  Mild tricuspid regurgitation. Mild pulmonary hypertension. RVSP measures  37 mmHg.  Prior study dated 01/28/2021: LVEF 40-45%, global hypokinesis, mild LVH, GLS  -5.5%, PASP 49 mmHg, mild MR.   EKG 03/29/2021: Sinus rhythm Nonspecific ST-T changes inferolateral leads   Coronary angiography 01/28/2021: LM: Normal LAD: Mid diffuse 50% disease        Small D2 ostial 70% stenosis LCx: Medium sized high OM1 with proximal 100% thrombotic occlusion        Likely culprit vessel with completed infarct RCA: Small. Mild luminal irregularities   Late presentation with  completed infarct from occlusion of high OM1 vessel No benefit from revascularization given late presentation Moderate non-bstructive disease in LAD Normal LVEDP    Recent labs: 06/15/2021: Glucose 166, BUN/Cr 11/0.52. EGFR >60. Na/K 135/3.6. Rest of the CMP normal Chol 142, TG 119, HDL 36, LDL 84   04/12/2021: Glucose 116, BUN/Cr 13/0.69. EGFR >60. Na/K 134/4.3. AlKP 239. Rest of the CMP  normal NT pro BNP 1725 (0-301) H/H 11/34. MCV 86. Platelets 198   3/22/20222: Glucose 94, BUN/Cr 12/0.7. EGFR 91. Na/K 139/4.9.  Chol 150, TG 106, HDL 32, LDL 98  01/29/2021: Glucose 147, BUN/Cr 14/0.64. EGFR >60. Na/K 135/3.0. H/H 11/32. MCV 83. Platelets 207 HbA1C N/A Chol 240, TG 128, HDL 45, LDL 169 TSH N/A    Review of Systems  Cardiovascular:  Positive for leg swelling. Negative for chest pain, dyspnea on exertion, palpitations and syncope.  Neurological:  Positive for light-headedness (Now resolved).        Vitals:   07/12/21 1225 07/12/21 1228  BP: (!) 159/88 (!) 145/95  Pulse: 81 85  Resp: 16   Temp: 98 F (36.7 C)   SpO2: 99%       Body mass index is 25.86 kg/m. Filed Weights   07/12/21 1225  Weight: 146 lb (66.2 kg)     Objective:   Physical Exam Vitals and nursing note reviewed.  Constitutional:      General: She is not in acute distress. Neck:     Vascular: No JVD.  Cardiovascular:     Rate and Rhythm: Normal rate and regular rhythm.     Heart sounds: Normal heart sounds. No murmur heard. Pulmonary:     Effort: Pulmonary effort is normal.     Breath sounds: Normal breath sounds. No wheezing or rales.  Abdominal:     Comments: Firm b/l abdominal masses  Musculoskeletal:     Right lower leg: Edema (1+) present.     Left lower leg: Edema (1+) present.          Assessment & Recommendations:   75 y.o. 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  CAD without angina: Late presentation MI with high OM1 occlusion (01/2019). No benefit with revascularization given late presentation. No current angina symptoms.  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 NYHA class I. Minimal leg edema symptoms, she wants to continue current therapy. I think this is reasonable. Continue metoprolol succinate 50 mg, Entresto 24-26 mg bid. Continue lasix 40 mg daily, with additional dose, as needed, for days with leg edema.  F/u in 3 months after repeat echocardiogram   Nigel Mormon, MD Pager: [phone] Office: [phone]

## 2021-09-19 ENCOUNTER — Emergency Department (HOSPITAL_COMMUNITY): Payer: Medicare Other

## 2021-09-19 ENCOUNTER — Emergency Department (HOSPITAL_BASED_OUTPATIENT_CLINIC_OR_DEPARTMENT_OTHER): Payer: Medicare Other

## 2021-09-19 ENCOUNTER — Encounter (HOSPITAL_COMMUNITY): Payer: Self-pay | Admitting: Emergency Medicine

## 2021-09-19 ENCOUNTER — Inpatient Hospital Stay (HOSPITAL_COMMUNITY)
Admission: EM | Admit: 2021-09-19 | Discharge: 2021-09-24 | DRG: 947 | Disposition: A | Discharge status: Hospice | Payer: Medicare Other | Attending: Internal Medicine | Admitting: Internal Medicine

## 2021-09-19 DIAGNOSIS — E785 Hyperlipidemia, unspecified: Secondary | ICD-10-CM | POA: Diagnosis present

## 2021-09-19 DIAGNOSIS — Z8543 Personal history of malignant neoplasm of ovary: Secondary | ICD-10-CM | POA: Diagnosis not present

## 2021-09-19 DIAGNOSIS — I5022 Chronic systolic (congestive) heart failure: Secondary | ICD-10-CM | POA: Diagnosis present

## 2021-09-19 DIAGNOSIS — Z79899 Other long term (current) drug therapy: Secondary | ICD-10-CM

## 2021-09-19 DIAGNOSIS — I255 Ischemic cardiomyopathy: Secondary | ICD-10-CM | POA: Diagnosis present

## 2021-09-19 DIAGNOSIS — E860 Dehydration: Secondary | ICD-10-CM | POA: Diagnosis present

## 2021-09-19 DIAGNOSIS — C799 Secondary malignant neoplasm of unspecified site: Secondary | ICD-10-CM | POA: Diagnosis present

## 2021-09-19 DIAGNOSIS — I25110 Atherosclerotic heart disease of native coronary artery with unstable angina pectoris: Secondary | ICD-10-CM | POA: Diagnosis present

## 2021-09-19 DIAGNOSIS — Z7982 Long term (current) use of aspirin: Secondary | ICD-10-CM | POA: Diagnosis not present

## 2021-09-19 DIAGNOSIS — Z66 Do not resuscitate: Secondary | ICD-10-CM | POA: Diagnosis present

## 2021-09-19 DIAGNOSIS — M7989 Other specified soft tissue disorders: Secondary | ICD-10-CM | POA: Diagnosis not present

## 2021-09-19 DIAGNOSIS — Z7902 Long term (current) use of antithrombotics/antiplatelets: Secondary | ICD-10-CM | POA: Diagnosis not present

## 2021-09-19 DIAGNOSIS — C569 Malignant neoplasm of unspecified ovary: Secondary | ICD-10-CM | POA: Diagnosis present

## 2021-09-19 DIAGNOSIS — I700 Atherosclerosis of aorta: Secondary | ICD-10-CM | POA: Diagnosis present

## 2021-09-19 DIAGNOSIS — Z9079 Acquired absence of other genital organ(s): Secondary | ICD-10-CM

## 2021-09-19 DIAGNOSIS — I214 Non-ST elevation (NSTEMI) myocardial infarction: Secondary | ICD-10-CM | POA: Diagnosis present

## 2021-09-19 DIAGNOSIS — Z9221 Personal history of antineoplastic chemotherapy: Secondary | ICD-10-CM

## 2021-09-19 DIAGNOSIS — R609 Edema, unspecified: Secondary | ICD-10-CM | POA: Diagnosis not present

## 2021-09-19 DIAGNOSIS — I249 Acute ischemic heart disease, unspecified: Secondary | ICD-10-CM | POA: Diagnosis present

## 2021-09-19 DIAGNOSIS — Z20822 Contact with and (suspected) exposure to covid-19: Secondary | ICD-10-CM | POA: Diagnosis present

## 2021-09-19 DIAGNOSIS — Z515 Encounter for palliative care: Secondary | ICD-10-CM | POA: Diagnosis not present

## 2021-09-19 DIAGNOSIS — I252 Old myocardial infarction: Secondary | ICD-10-CM

## 2021-09-19 DIAGNOSIS — Z90722 Acquired absence of ovaries, bilateral: Secondary | ICD-10-CM

## 2021-09-19 DIAGNOSIS — D630 Anemia in neoplastic disease: Secondary | ICD-10-CM | POA: Diagnosis present

## 2021-09-19 DIAGNOSIS — Z96641 Presence of right artificial hip joint: Secondary | ICD-10-CM | POA: Diagnosis present

## 2021-09-19 DIAGNOSIS — I110 Hypertensive heart disease with heart failure: Secondary | ICD-10-CM | POA: Diagnosis present

## 2021-09-19 DIAGNOSIS — E871 Hypo-osmolality and hyponatremia: Secondary | ICD-10-CM | POA: Diagnosis present

## 2021-09-19 DIAGNOSIS — G893 Neoplasm related pain (acute) (chronic): Principal | ICD-10-CM | POA: Diagnosis present

## 2021-09-19 DIAGNOSIS — Z90710 Acquired absence of both cervix and uterus: Secondary | ICD-10-CM | POA: Diagnosis not present

## 2021-09-19 DIAGNOSIS — N1339 Other hydronephrosis: Secondary | ICD-10-CM | POA: Diagnosis present

## 2021-09-19 LAB — CBC WITH DIFFERENTIAL/PLATELET
Abs Immature Granulocytes: 0.06 10*3/uL (ref 0.00–0.07)
Basophils Absolute: 0 10*3/uL (ref 0.0–0.1)
Basophils Relative: 0 %
Eosinophils Absolute: 0 10*3/uL (ref 0.0–0.5)
Eosinophils Relative: 0 %
HCT: 31.9 % — ABNORMAL LOW (ref 36.0–46.0)
Hemoglobin: 9.9 g/dL — ABNORMAL LOW (ref 12.0–15.0)
Immature Granulocytes: 1 %
Lymphocytes Relative: 4 %
Lymphs Abs: 0.5 10*3/uL — ABNORMAL LOW (ref 0.7–4.0)
MCH: 26.8 pg (ref 26.0–34.0)
MCHC: 31 g/dL (ref 30.0–36.0)
MCV: 86.2 fL (ref 80.0–100.0)
Monocytes Absolute: 0.5 10*3/uL (ref 0.1–1.0)
Monocytes Relative: 5 %
Neutro Abs: 9.7 10*3/uL — ABNORMAL HIGH (ref 1.7–7.7)
Neutrophils Relative %: 90 %
Platelets: 262 10*3/uL (ref 150–400)
RBC: 3.7 MIL/uL — ABNORMAL LOW (ref 3.87–5.11)
RDW: 14.6 % (ref 11.5–15.5)
WBC: 10.7 10*3/uL — ABNORMAL HIGH (ref 4.0–10.5)
nRBC: 0 % (ref 0.0–0.2)

## 2021-09-19 LAB — COMPREHENSIVE METABOLIC PANEL
ALT: 15 U/L (ref 0–44)
AST: 28 U/L (ref 15–41)
Albumin: 3.8 g/dL (ref 3.5–5.0)
Alkaline Phosphatase: 105 U/L (ref 38–126)
Anion gap: 13 (ref 5–15)
BUN: 15 mg/dL (ref 8–23)
CO2: 23 mmol/L (ref 22–32)
Calcium: 9.7 mg/dL (ref 8.9–10.3)
Chloride: 99 mmol/L (ref 98–111)
Creatinine, Ser: 0.81 mg/dL (ref 0.44–1.00)
GFR, Estimated: >60 mL/min (ref >60)
Glucose, Bld: 189 mg/dL — ABNORMAL HIGH (ref 70–99)
Potassium: 3.7 mmol/L (ref 3.5–5.1)
Sodium: 135 mmol/L (ref 135–145)
Total Bilirubin: 0.7 mg/dL (ref 0.3–1.2)
Total Protein: 6.8 g/dL (ref 6.5–8.1)

## 2021-09-19 LAB — RESP PANEL BY RT-PCR (FLU A&B, COVID) ARPGX2
Influenza A by PCR: NEGATIVE
Influenza B by PCR: NEGATIVE
SARS Coronavirus 2 by RT PCR: NEGATIVE

## 2021-09-19 LAB — URINALYSIS, ROUTINE W REFLEX MICROSCOPIC
Bilirubin Urine: NEGATIVE
Glucose, UA: NEGATIVE mg/dL
Hgb urine dipstick: NEGATIVE
Ketones, ur: 5 mg/dL — AB
Nitrite: NEGATIVE
Protein, ur: 100 mg/dL — AB
Specific Gravity, Urine: 1.02 (ref 1.005–1.030)
pH: 5 (ref 5.0–8.0)

## 2021-09-19 LAB — PROTEIN / CREATININE RATIO, URINE
Creatinine, Urine: 167.4 mg/dL
Protein Creatinine Ratio: 0.62 mg/mg{Cre} — ABNORMAL HIGH (ref 0.00–0.15)
Total Protein, Urine: 104 mg/dL

## 2021-09-19 LAB — TROPONIN I (HIGH SENSITIVITY)
Troponin I (High Sensitivity): 654 ng/L (ref <18)
Troponin I (High Sensitivity): 826 ng/L (ref <18)
Troponin I (High Sensitivity): 813 ng/L (ref <18)

## 2021-09-19 LAB — PROTIME-INR
INR: 1.2 (ref 0.8–1.2)
Prothrombin Time: 14.9 seconds (ref 11.4–15.2)

## 2021-09-19 LAB — LIPASE, BLOOD: Lipase: 24 U/L (ref 11–51)

## 2021-09-19 LAB — D-DIMER, QUANTITATIVE: D-Dimer, Quant: 3.08 ug/mL-FEU — ABNORMAL HIGH (ref 0.00–0.50)

## 2021-09-19 MED ORDER — HEPARIN (PORCINE) 25000 UT/250ML-% IV SOLN
800.0000 [IU]/h | INTRAVENOUS | Status: DC
  Administered 2021-09-19: 800 [IU]/h via INTRAVENOUS
  Filled 2021-09-19: qty 250

## 2021-09-19 MED ORDER — HYDROCODONE-ACETAMINOPHEN 5-325 MG PO TABS
1.0000 | ORAL_TABLET | ORAL | Status: DC | PRN
  Administered 2021-09-19: 1 via ORAL
  Filled 2021-09-19: qty 1

## 2021-09-19 MED ORDER — FUROSEMIDE 40 MG PO TABS
40.0000 mg | ORAL_TABLET | Freq: Every day | ORAL | Status: DC
  Administered 2021-09-21 – 2021-09-24 (×4): 40 mg via ORAL
  Filled 2021-09-19 (×4): qty 1

## 2021-09-19 MED ORDER — SACUBITRIL-VALSARTAN 24-26 MG PO TABS: 1.0000 | ORAL_TABLET | Freq: Two times a day (BID) | ORAL | Status: DC

## 2021-09-19 MED ORDER — ONDANSETRON HCL 4 MG PO TABS
4.0000 mg | ORAL_TABLET | Freq: Four times a day (QID) | ORAL | Status: DC | PRN
  Administered 2021-09-19: 4 mg via ORAL
  Filled 2021-09-19: qty 1

## 2021-09-19 MED ORDER — SACUBITRIL-VALSARTAN 24-26 MG PO TABS
1.0000 | ORAL_TABLET | Freq: Two times a day (BID) | ORAL | Status: DC
  Administered 2021-09-20: 1 via ORAL
  Filled 2021-09-19 (×2): qty 1

## 2021-09-19 MED ORDER — SENNOSIDES-DOCUSATE SODIUM 8.6-50 MG PO TABS
1.0000 | ORAL_TABLET | Freq: Every evening | ORAL | Status: DC | PRN
  Administered 2021-09-20: 1 via ORAL
  Filled 2021-09-19: qty 1

## 2021-09-19 MED ORDER — ASPIRIN 81 MG PO TBEC: 81.0000 mg | DELAYED_RELEASE_TABLET | Freq: Every day | ORAL | Status: DC

## 2021-09-19 MED ORDER — HEPARIN SODIUM (PORCINE) 5000 UNIT/ML IJ SOLN
5000.0000 [IU] | Freq: Two times a day (BID) | INTRAMUSCULAR | Status: DC
  Filled 2021-09-19: qty 1

## 2021-09-19 MED ORDER — ONDANSETRON HCL 4 MG/2ML IJ SOLN
4.0000 mg | Freq: Four times a day (QID) | INTRAMUSCULAR | Status: DC | PRN
  Administered 2021-09-20: 4 mg via INTRAVENOUS
  Filled 2021-09-19: qty 2

## 2021-09-19 MED ORDER — CLOPIDOGREL BISULFATE 75 MG PO TABS
75.0000 mg | ORAL_TABLET | Freq: Every day | ORAL | Status: DC
  Administered 2021-09-20 – 2021-09-23 (×4): 75 mg via ORAL
  Filled 2021-09-19 (×4): qty 1

## 2021-09-19 MED ORDER — METOPROLOL TARTRATE 25 MG PO TABS
37.5000 mg | ORAL_TABLET | Freq: Two times a day (BID) | ORAL | Status: DC
  Administered 2021-09-19 – 2021-09-24 (×8): 37.5 mg via ORAL
  Filled 2021-09-19 (×3): qty 1
  Filled 2021-09-19: qty 2
  Filled 2021-09-19 (×4): qty 1

## 2021-09-19 MED ORDER — SODIUM CHLORIDE 0.9 % IV BOLUS
500.0000 mL | Freq: Once | INTRAVENOUS | Status: AC
  Administered 2021-09-19: 500 mL via INTRAVENOUS

## 2021-09-19 MED ORDER — IOHEXOL 350 MG/ML SOLN
100.0000 mL | Freq: Once | INTRAVENOUS | Status: AC | PRN
  Administered 2021-09-19: 100 mL via INTRAVENOUS

## 2021-09-19 MED ORDER — ASPIRIN 325 MG PO TABS
325.0000 mg | ORAL_TABLET | Freq: Every day | ORAL | Status: DC
  Administered 2021-09-19 – 2021-09-23 (×5): 325 mg via ORAL
  Filled 2021-09-19 (×5): qty 1

## 2021-09-19 MED ORDER — HYDROCODONE-ACETAMINOPHEN 5-325 MG PO TABS
1.0000 | ORAL_TABLET | Freq: Once | ORAL | Status: AC
  Administered 2021-09-19: 1 via ORAL
  Filled 2021-09-19: qty 1

## 2021-09-19 MED ORDER — ATORVASTATIN CALCIUM 80 MG PO TABS
80.0000 mg | ORAL_TABLET | Freq: Every day | ORAL | Status: DC
  Administered 2021-09-20 – 2021-09-22 (×3): 80 mg via ORAL
  Filled 2021-09-19 (×3): qty 1

## 2021-09-19 NOTE — Progress Notes (Signed)
I received a call from the ED physician regarding cardiology consultation in view of elevated serum troponin.  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  Risks of heparin outweigh the benefits and in a patient with metastatic disease, would not recommend any further cardiac work-up except palliation.  From cardiac standpoint we will be happy to see her to make final recommendations however would not recommend anticoagulation full dose.   Adrian Prows, MD, [facility] 09/19/2021, 2:05 PM Office: [phone] Fax: [phone] Pager: [phone]

## 2021-09-19 NOTE — ED Notes (Signed)
Paged attending for RN 

## 2021-09-19 NOTE — ED Provider Notes (Signed)
[facility] EMERGENCY DEPARTMENT Provider Note   CSN: [id number] Arrival date & time: 09/19/21  3149     History No chief complaint on file.   Jennifer Watkins is a 75 y.o. female.  Patient presents with increased abdominal pain and shortness of breath.  She has a history of chronic abdominal pain due to ovarian cancer.  This was described to her as no longer treatable, patient is now on palliative care regarding her cancer.  However yesterday she had increased abdominal pain and shortness of breath and so presents to the ER for evaluation.  Denies fevers or cough or vomiting or diarrhea.      Past Medical History:  Diagnosis Date   Hyperlipidemia    Myocardial infarction ([place]) 0302/2022   Ovarian cancer [facility])    ovarian    Patient Active Problem List   Diagnosis Date Noted   Preop cardiovascular exam 02/17/2021   Coronary artery disease involving coronary bypass graft of native heart without angina pectoris 02/02/2021   HFrEF (heart failure with reduced ejection fraction) ([place]) 02/02/2021   [place] cell carcinoma ([place]) 02/01/2021   ACS (acute coronary syndrome) ([place]) 01/28/2021   NSTEMI (non-ST elevated myocardial infarction) [facility])     Past Surgical History:  Procedure Laterality Date   LEFT HEART CATH AND CORONARY ANGIOGRAPHY N/A 01/28/2021   Procedure: LEFT HEART CATH AND CORONARY ANGIOGRAPHY;  Surgeon: Nigel Mormon, MD;  Location: [place] [facility];  Service: Cardiovascular;  Laterality: N/A;   TOTAL ABDOMINAL HYSTERECTOMY W/ BILATERAL SALPINGOOPHORECTOMY     TOTAL HIP ARTHROPLASTY       OB History   No obstetric history on file.     Family History  Problem Relation Age of Onset   Heart Problems Mother    Breast cancer Mother        dx after age 20   Heart Problems Father    Non-Hodgkin's lymphoma Father    Prostate cancer Brother        dx after age 60; no mets   Leukemia Sister 2   Ovarian cancer Neg Hx    Uterine cancer  Neg Hx     Social History   Tobacco Use   Smoking status: Never   Smokeless tobacco: Never  Vaping Use   Vaping Use: Never used  Substance Use Topics   Alcohol use: Not Currently   Drug use: Never    Home Medications Prior to Admission medications   Medication Sig Start Date End Date Taking? Authorizing Provider  ASPIRIN LOW DOSE 81 MG EC tablet TAKE 1 TABLET(81 MG) BY MOUTH DAILY. SWALLOW WHOLE 05/28/21   Patwardhan, Reynold Bowen, MD  atorvastatin (LIPITOR) 80 MG tablet Take 1 tablet (80 mg total) by mouth daily at 6 PM. 03/05/21   Patwardhan, Manish J, MD  clopidogrel (PLAVIX) 75 MG tablet Take 75 mg by mouth daily.    [provider]  furosemide (LASIX) 40 MG tablet Take 40 mg by mouth.    [provider]  HYDROcodone-acetaminophen (NORCO/VICODIN) 5-325 MG tablet Take 1 tablet by mouth every 6 (six) hours as needed for severe pain. 06/15/21   Long, Wonda Olds, MD  metoprolol succinate (TOPROL-XL) 50 MG 24 hr tablet Take 1 tablet (50 mg total) by mouth daily. Take with or immediately following a meal. 03/05/21   Patwardhan, Manish J, MD  nitroGLYCERIN (NITROSTAT) 0.4 MG SL tablet Place 1 tablet (0.4 mg total) under the tongue every 5 (five) minutes x 3 doses as  needed for chest pain. 03/05/21   Patwardhan, Reynold Bowen, MD  sacubitril-valsartan (ENTRESTO) 24-26 MG Take 1 tablet by mouth 2 (two) times daily. 03/05/21   Patwardhan, Reynold Bowen, MD  senna-docusate (SENOKOT-S) 8.6-50 MG tablet Take 1 tablet by mouth at bedtime as needed for mild constipation or moderate constipation. 06/15/21   Long, Wonda Olds, MD    Allergies    Patient has no known allergies.  Review of Systems   Review of Systems  Constitutional:  Negative for fever.  HENT:  Negative for ear pain.   Eyes:  Negative for pain.  Respiratory:  Positive for shortness of breath. Negative for cough.   Cardiovascular:  Negative for chest pain.  Gastrointestinal:  Positive for abdominal pain.  Genitourinary:  Negative for  flank pain.  Musculoskeletal:  Negative for back pain.  Skin:  Negative for rash.  Neurological:  Negative for headaches.   Physical Exam Updated Vital Signs BP (!) 148/83   Pulse 94   Temp (!) 97.4 F (36.3 C) (Oral)   Resp 19   SpO2 96%   Physical Exam Constitutional:      General: She is not in acute distress.    Appearance: Normal appearance.  HENT:     Head: Normocephalic.     Nose: Nose normal.  Eyes:     Extraocular Movements: Extraocular movements intact.  Cardiovascular:     Rate and Rhythm: Tachycardia present.  Pulmonary:     Effort: Pulmonary effort is normal.  Abdominal:     General: There is distension.     Palpations: There is mass.     Tenderness: There is abdominal tenderness.  Musculoskeletal:        General: Normal range of motion.     Cervical back: Normal range of motion.  Neurological:     General: No focal deficit present.     Mental Status: She is alert. Mental status is at baseline.    ED Results / Procedures / Treatments   Labs (all labs ordered are listed, but only abnormal results are displayed) Labs Reviewed  CBC WITH DIFFERENTIAL/PLATELET - Abnormal; Notable for the following components:      Result Value   WBC 10.7 (*)    RBC 3.70 (*)    Hemoglobin 9.9 (*)    HCT 31.9 (*)    Neutro Abs 9.7 (*)    Lymphs Abs 0.5 (*)    All other components within normal limits  COMPREHENSIVE METABOLIC PANEL - Abnormal; Notable for the following components:   Glucose, Bld 189 (*)    All other components within normal limits  URINALYSIS, ROUTINE W REFLEX MICROSCOPIC - Abnormal; Notable for the following components:   Color, Urine AMBER (*)    APPearance HAZY (*)    Ketones, ur 5 (*)    Protein, ur 100 (*)    Leukocytes,Ua TRACE (*)    Bacteria, UA RARE (*)    All other components within normal limits  D-DIMER, QUANTITATIVE - Abnormal; Notable for the following components:   D-Dimer, Quant 3.08 (*)    All other components within normal limits   TROPONIN I (HIGH SENSITIVITY) - Abnormal; Notable for the following components:   Troponin I (High Sensitivity) 654 (*)    All other components within normal limits  TROPONIN I (HIGH SENSITIVITY) - Abnormal; Notable for the following components:   Troponin I (High Sensitivity) 826 (*)    All other components within normal limits  TROPONIN I (HIGH SENSITIVITY) - Abnormal; Notable  for the following components:   Troponin I (High Sensitivity) 813 (*)    All other components within normal limits  RESP PANEL BY RT-PCR (FLU A&B, COVID) ARPGX2  LIPASE, BLOOD  HEPARIN LEVEL (UNFRACTIONATED)  PROTIME-INR    EKG EKG Interpretation  Date/Time:  Sunday September 19 2021 09:23:39 EDT Ventricular Rate:  103 PR Interval:  159 QRS Duration: 98 QT Interval:  340 QTC Calculation: 445 R Axis:   114 Text Interpretation: Right and left arm electrode reversal, interpretation assumes no reversal Sinus tachycardia Ventricular premature complex Right axis deviation Borderline T abnormalities, diffuse leads Confirmed by Thamas Jaegers ([id number]) on 09/19/2021 1:17:31 PM  Radiology DG Chest 2 View  Result Date: 09/19/2021 CLINICAL DATA:  Shortness of breath. Patient is on palliative care for ovarian cancer. EXAM: CHEST - 2 VIEW COMPARISON:  Chest radiograph 01/27/2021; CT chest 04/19/2021 FINDINGS: Trace pleural fluid versus pleural thickening at the costophrenic angle with right basilar subsegmental atelectasis, similar to 01/27/2021. No new focal consolidation. No large volume pleural effusion. No pneumothorax. Heart is normal in size. No acute osseous abnormality. IMPRESSION: No acute cardiopulmonary abnormality. Trace pleural fluid versus pleural thickening at the costophrenic angles with right subsegmental basilar atelectasis, similar to prior exam 01/27/2021. Electronically Signed   By: Ileana Roup M.D.   On: 09/19/2021 09:03   CT Angio Chest PE W/Cm &/Or Wo Cm  Result Date: 09/19/2021 CLINICAL DATA:   Chest pain or shortness of breath, pleurisy or effusion suspected. Acute nonlocalized worsening chronic abdominal pain. Clinical concern for pulmonary embolism. EXAM: CT ANGIOGRAPHY CHEST CT ABDOMEN AND PELVIS WITH CONTRAST TECHNIQUE: Multidetector CT imaging of the chest was performed using the standard protocol during bolus administration of intravenous contrast. Multiplanar CT image reconstructions and MIPs were obtained to evaluate the vascular anatomy. Multidetector CT imaging of the abdomen and pelvis was performed using the standard protocol during bolus administration of intravenous contrast. CONTRAST:  123mL OMNIPAQUE IOHEXOL 350 MG/ML SOLN COMPARISON:  CTs of the chest, abdomen and pelvis 04/19/2021. FINDINGS: CTA CHEST FINDINGS Cardiovascular: The pulmonary arteries are well opacified with contrast to the level of the subsegmental branches. There is no evidence of acute pulmonary embolism. There is limited opacification of the systemic arteries which demonstrate no gross acute abnormality. There is mild atherosclerosis of the aorta, great vessels and coronary arteries. The heart size is normal. There is no pericardial effusion. Mediastinum/Nodes: Anasarca with generalized subcutaneous and mediastinal edema. There is ill-defined fluid tracking superiorly from the abdomen into the retrocrural space. No discrete enlarged mediastinal, hilar or axillary lymph nodes are seen. The thyroid gland, trachea and esophagus demonstrate no significant findings. Lungs/Pleura: New small bilateral pleural effusions. There are new streaky opacities at both lung bases which are most consistent with atelectasis. In addition, there are patchy ground-glass opacities elsewhere, greatest in the right upper lobe, which may reflect edema. No confluent airspace opacity, dominant mass or suspicious pulmonary nodularity identified. Musculoskeletal/Chest wall: No chest wall mass or suspicious osseous findings. Grossly stable  degenerative changes in the spine and both shoulders. CT ABDOMEN AND PELVIS FINDINGS Hepatobiliary: The liver is normal in density without suspicious focal abnormality. There is extrinsic mass effect on the right hepatic lobe by the enlarging intra-masses described below. Small gallstones are noted. No evidence of gallbladder wall thickening or significant biliary dilatation. Pancreas: Displaced by the intra-abdominal masses. No pancreatic mass or ductal dilatation demonstrated. Spleen: Normal in size without focal abnormality. Adrenals/Urinary Tract: As before, the adrenal glands are not well visualized. There  is persistent bilateral hydronephrosis with delayed contrast excretion, similar to previous study. 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 There is a chronic obstructing 1 cm calculus at the left ureteropelvic junction, similar to previous study. The bladder appears decompressed and partly obscured by artifact from the right total hip arthroplasty. No bladder abnormality identified. Stomach/Bowel: No enteric contrast administered. There is peripheral displacement of bowel by the multiple enlarging intra-abdominal masses. The stomach appears unremarkable for its degree of distension. No evidence of bowel wall thickening, distention or surrounding inflammatory change. Vascular/Lymphatic: Bulky adenopathy again noted in the upper retroperitoneum with a retroaortic nodal mass measuring up to 4.9 x 3.5 cm on image 31/4. These nodal masses have mildly progressed. No acute vascular findings are seen. The portal, superior mesenteric and splenic veins appear patent. There is aortic and branch vessel atherosclerosis. Reproductive: Status post hysterectomy. Other: There are multiple enlarging complex intra-abdominal masses. A predominantly solid lesion in the left mid abdomen measures 17.0 x 14.7 cm on image 51/4 (previously 14.5 x 12.8 cm). A more complex cystic  appearing lesion in the right mid abdomen measures 18.4 x 11.0 cm on image 34/4 (previously 17.8 x 11.5 cm). Multiple additional complex solid and cystic lesions have also mildly enlarged. There is mild ascites. There is progressive generalized anasarca with edema throughout the subcutaneous and deep fat. Musculoskeletal: No apparent osseous metastases or acute osseous findings. Status post right total hip arthroplasty. Multilevel lumbar spondylosis is grossly unchanged. Review of the MIP images confirms the above findings. IMPRESSION: 1. No evidence of acute pulmonary embolism. 2. Anasarca with generalized soft tissue edema, new pleural effusions and mildly increased abdominal ascites. 3. Further progression in multiple complex intra-abdominal and retroperitoneal masses consistent with progressive widely metastatic ovarian cancer as correlated with previous radiology reports. No definite thoracic metastatic disease. 4. Increased atelectasis at both lung bases with patchy ground-glass opacities in both lungs which may reflect edema. 5. Chronic bilateral hydronephrosis with chronic obstructing proximal left ureteral calculus. 6. Cholelithiasis. Electronically Signed   By: Richardean Sale M.D.   On: 09/19/2021 12:34   CT Abdomen Pelvis W Contrast  Result Date: 09/19/2021 CLINICAL DATA:  Chest pain or shortness of breath, pleurisy or effusion suspected. Acute nonlocalized worsening chronic abdominal pain. Clinical concern for pulmonary embolism. EXAM: CT ANGIOGRAPHY CHEST CT ABDOMEN AND PELVIS WITH CONTRAST TECHNIQUE: Multidetector CT imaging of the chest was performed using the standard protocol during bolus administration of intravenous contrast. Multiplanar CT image reconstructions and MIPs were obtained to evaluate the vascular anatomy. Multidetector CT imaging of the abdomen and pelvis was performed using the standard protocol during bolus administration of intravenous contrast. CONTRAST:  173mL OMNIPAQUE  IOHEXOL 350 MG/ML SOLN COMPARISON:  CTs of the chest, abdomen and pelvis 04/19/2021. FINDINGS: CTA CHEST FINDINGS Cardiovascular: The pulmonary arteries are well opacified with contrast to the level of the subsegmental branches. There is no evidence of acute pulmonary embolism. There is limited opacification of the systemic arteries which demonstrate no gross acute abnormality. There is mild atherosclerosis of the aorta, great vessels and coronary arteries. The heart size is normal. There is no pericardial effusion. Mediastinum/Nodes: Anasarca with generalized subcutaneous and mediastinal edema. There is ill-defined fluid tracking superiorly from the abdomen into the retrocrural space. No discrete enlarged mediastinal, hilar or axillary lymph nodes are seen. The thyroid gland, trachea and esophagus demonstrate no significant findings. Lungs/Pleura: New small bilateral pleural effusions. There are new streaky opacities at both lung bases  which are most consistent with atelectasis. In addition, there are patchy ground-glass opacities elsewhere, greatest in the right upper lobe, which may reflect edema. No confluent airspace opacity, dominant mass or suspicious pulmonary nodularity identified. Musculoskeletal/Chest wall: No chest wall mass or suspicious osseous findings. Grossly stable degenerative changes in the spine and both shoulders. CT ABDOMEN AND PELVIS FINDINGS Hepatobiliary: The liver is normal in density without suspicious focal abnormality. There is extrinsic mass effect on the right hepatic lobe by the enlarging intra-masses described below. Small gallstones are noted. No evidence of gallbladder wall thickening or significant biliary dilatation. Pancreas: Displaced by the intra-abdominal masses. No pancreatic mass or ductal dilatation demonstrated. Spleen: Normal in size without focal abnormality. Adrenals/Urinary Tract: As before, the adrenal glands are not well visualized. There is persistent bilateral  hydronephrosis with delayed contrast excretion, similar to previous study. 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 There is a chronic obstructing 1 cm calculus at the left ureteropelvic junction, similar to previous study. The bladder appears decompressed and partly obscured by artifact from the right total hip arthroplasty. No bladder abnormality identified. Stomach/Bowel: No enteric contrast administered. There is peripheral displacement of bowel by the multiple enlarging intra-abdominal masses. The stomach appears unremarkable for its degree of distension. No evidence of bowel wall thickening, distention or surrounding inflammatory change. Vascular/Lymphatic: Bulky adenopathy again noted in the upper retroperitoneum with a retroaortic nodal mass measuring up to 4.9 x 3.5 cm on image 31/4. These nodal masses have mildly progressed. No acute vascular findings are seen. The portal, superior mesenteric and splenic veins appear patent. There is aortic and branch vessel atherosclerosis. Reproductive: Status post hysterectomy. Other: There are multiple enlarging complex intra-abdominal masses. A predominantly solid lesion in the left mid abdomen measures 17.0 x 14.7 cm on image 51/4 (previously 14.5 x 12.8 cm). A more complex cystic appearing lesion in the right mid abdomen measures 18.4 x 11.0 cm on image 34/4 (previously 17.8 x 11.5 cm). Multiple additional complex solid and cystic lesions have also mildly enlarged. There is mild ascites. There is progressive generalized anasarca with edema throughout the subcutaneous and deep fat. Musculoskeletal: No apparent osseous metastases or acute osseous findings. Status post right total hip arthroplasty. Multilevel lumbar spondylosis is grossly unchanged. Review of the MIP images confirms the above findings. IMPRESSION: 1. No evidence of acute pulmonary embolism. 2. Anasarca with generalized soft tissue edema, new pleural  effusions and mildly increased abdominal ascites. 3. Further progression in multiple complex intra-abdominal and retroperitoneal masses consistent with progressive widely metastatic ovarian cancer as correlated with previous radiology reports. No definite thoracic metastatic disease. 4. Increased atelectasis at both lung bases with patchy ground-glass opacities in both lungs which may reflect edema. 5. Chronic bilateral hydronephrosis with chronic obstructing proximal left ureteral calculus. 6. Cholelithiasis. Electronically Signed   By: Richardean Sale M.D.   On: 09/19/2021 12:34   VAS [place] LOWER EXTREMITY VENOUS (DVT) (ONLY [facility] & [facility])  Result Date: 09/19/2021  Lower Venous DVT Study Patient Name:  Jennifer Watkins  Date of Exam:   09/19/2021 Medical Rec #: [id number]        Accession #:    [id number] Date of Birth: 1946-09-28         Patient Gender: F Patient Age:   62 years Exam Location:  [facility] Procedure:      VAS [place] LOWER EXTREMITY VENOUS (DVT) Referring Phys: RILEY RANSOM --------------------------------------------------------------------------------  Indications: Swelling, and Edema.  Comparison Study: no  prior Performing Technologist: Archie Patten RVS  Examination Guidelines: A complete evaluation includes B-mode imaging, spectral Doppler, color Doppler, and power Doppler as needed of all accessible portions of each vessel. Bilateral testing is considered an integral part of a complete examination. Limited examinations for reoccurring indications may be performed as noted. The reflux portion of the exam is performed with the patient in reverse Trendelenburg.  +-----+---------------+---------+-----------+----------+--------------+ RIGHTCompressibilityPhasicitySpontaneityPropertiesThrombus Aging +-----+---------------+---------+-----------+----------+--------------+ CFV  Full           Yes      Yes                                  +-----+---------------+---------+-----------+----------+--------------+   +---------+---------------+---------+-----------+----------+--------------+ LEFT     CompressibilityPhasicitySpontaneityPropertiesThrombus Aging +---------+---------------+---------+-----------+----------+--------------+ CFV      Full           Yes      Yes                                 +---------+---------------+---------+-----------+----------+--------------+ SFJ      Full                                                        +---------+---------------+---------+-----------+----------+--------------+ FV Prox  Full                                                        +---------+---------------+---------+-----------+----------+--------------+ FV Mid   Full                                                        +---------+---------------+---------+-----------+----------+--------------+ FV DistalFull                                                        +---------+---------------+---------+-----------+----------+--------------+ PFV      Full                                                        +---------+---------------+---------+-----------+----------+--------------+ POP      Full           Yes      Yes                                 +---------+---------------+---------+-----------+----------+--------------+ PTV      Full                                                        +---------+---------------+---------+-----------+----------+--------------+  Summary: RIGHT: - No evidence of common femoral vein obstruction.  LEFT: - There is no evidence of deep vein thrombosis in the lower extremity.  - No cystic structure found in the popliteal fossa.  *See table(s) above for measurements and observations. Electronically signed by Deitra Mayo MD on 09/19/2021 at 1:30:45 PM.    Final     Procedures .Critical Care Performed by: Luna Fuse, MD Authorized by: Luna Fuse, MD   Critical care provider statement:    Critical care time (minutes):  40   Critical care time was exclusive of:  Separately billable procedures and treating other patients and teaching time   Critical care was necessary to treat or prevent imminent or life-threatening deterioration of the following conditions:  Cardiac failure   Medications Ordered in ED Medications  aspirin tablet 325 mg (325 mg Oral Given 09/19/21 1133)  heparin ADULT infusion 100 units/mL (25000 units/254mL) (800 Units/hr Intravenous New Bag/Given 09/19/21 1408)  sodium chloride 0.9 % bolus 500 mL (0 mLs Intravenous Stopped 09/19/21 1135)  HYDROcodone-acetaminophen (NORCO/VICODIN) 5-325 MG per tablet 1 tablet (1 tablet Oral Given 09/19/21 1025)  iohexol (OMNIPAQUE) 350 MG/ML injection 100 mL (100 mLs Intravenous Contrast Given 09/19/21 1203)    ED Course  I have reviewed the triage vital signs and the nursing notes.  Pertinent labs & imaging results that were available during my care of the patient were reviewed by me and considered in my medical decision making (see chart for details).    MDM Rules/Calculators/A&P                           Patient has increased oxygen demand, as well as elevated troponin here in the ER.  EKG shows sinus rhythm no ST elevation depressions no T wave inversions noted.   CT imaging of chest and pelvis pursued.  No evidence of pulm embolism noted.  Patient started on heparin drip as troponin appears elevated and repeat troponin continues to climb upwards.  Third troponin finally shows off of troponin.  Case discussed with cardiology who will see the patient.  Patient admitted to the hospitalist team.   Final Clinical Impression(s) / ED Diagnoses Final diagnoses:  NSTEMI (non-ST elevated myocardial infarction) [facility])  Metastatic malignant neoplasm, unspecified site [facility])    Rx / DC Orders ED Discharge Orders     None        Luna Fuse, MD 09/19/21  1425

## 2021-09-19 NOTE — ED Notes (Signed)
EDP aware of elevated troponin.

## 2021-09-19 NOTE — Progress Notes (Signed)
As per Cardiology, will stop heparin drip.

## 2021-09-19 NOTE — ED Notes (Signed)
Pt denies chest pain

## 2021-09-19 NOTE — Progress Notes (Signed)
Lower extremity venous has been completed.   Preliminary results in CV Proc.   Jennifer Watkins 09/19/2021 9:34 AM

## 2021-09-19 NOTE — H&P (Signed)
History and Physical    Jennifer Watkins QBV:[id number] DOB: 1945/12/07 DOA: 09/19/2021  PCP: Gayland Curry, DO (Confirm with patient/family/NH records and if not entered, this has to be entered at [facility] point of entry) Patient coming from: Home  I have personally briefly reviewed patient's old medical records in [place]  Chief Complaint: N/V  HPI: Jennifer Watkins is a 75 y.o. female with medical history significant of CAD with NSTEMI March 2022, with cath showing 100% occlusion of proximal LCx on Plavix aspirin, chronic systolic CHF LVEF 40 to 28% on Entresto, metastatic ovarian cancer of chemotherapy on home palliative care, HTN, HLD, chronic bilateral obstructive uropathy/hydronephrosis, chronic normocytic anemia, came with worsening of nausea and vomiting.  Patient has been on Vicodin for cancer pain control at home, she ran out of Vicodin yesterday and called her doctor who ordered OxyContin which she tried 1 yesterday morning and started to have nauseous and frequent vomiting stomach content through evening.  Unable to eat or drink since yesterday afternoon.  She feels very dehydrated but she denies any chest pain shortness of breath change of pattern of her abdominal pain or diarrhea.  No fever or chills.  ED Course: Vital signs stable, no tachycardia, CTA negative for PE, chronic bilateral hydronephrosis.  Troponin 654> [id number].  EKG no acute ST changes.  Cardiology Dr. Einar Gip consulted, ED started patient on heparin drip.  Review of Systems: As per HPI otherwise 10 point review of systems negative.   Past Medical History:  Diagnosis Date   Hyperlipidemia    Myocardial infarction ([place]) 0302/2022   Ovarian cancer ([place])    ovarian    Past Surgical History:  Procedure Laterality Date   LEFT HEART CATH AND CORONARY ANGIOGRAPHY N/A 01/28/2021   Procedure: LEFT HEART CATH AND CORONARY ANGIOGRAPHY;  Surgeon: Nigel Mormon, MD;  Location: [facility];  Service:  Cardiovascular;  Laterality: N/A;   TOTAL ABDOMINAL HYSTERECTOMY W/ BILATERAL SALPINGOOPHORECTOMY     TOTAL HIP ARTHROPLASTY       reports that she has never smoked. She has never used smokeless tobacco. She reports that she does not currently use alcohol. She reports that she does not use drugs.  No Known Allergies  Family History  Problem Relation Age of Onset   Heart Problems Mother    Breast cancer Mother        dx after age 68   Heart Problems Father    Non-Hodgkin's lymphoma Father    Prostate cancer Brother        dx after age 21; no mets   Leukemia Sister 27   Ovarian cancer Neg Hx    Uterine cancer Neg Hx      Prior to Admission medications   Medication Sig Start Date End Date Taking? Authorizing Provider  ASPIRIN LOW DOSE 81 MG EC tablet TAKE 1 TABLET(81 MG) BY MOUTH DAILY. SWALLOW WHOLE Patient taking differently: Take 81 mg by mouth daily. 05/28/21   Patwardhan, Reynold Bowen, MD  atorvastatin (LIPITOR) 80 MG tablet Take 1 tablet (80 mg total) by mouth daily at 6 PM. 03/05/21   Patwardhan, Manish J, MD  clopidogrel (PLAVIX) 75 MG tablet Take 75 mg by mouth daily.    [provider]  furosemide (LASIX) 40 MG tablet Take 40 mg by mouth.    [provider]  HYDROcodone-acetaminophen (NORCO/VICODIN) 5-325 MG tablet Take 1 tablet by mouth every 6 (six) hours as needed for severe pain. 06/15/21   Long, Wonda Olds,  MD  metoprolol succinate (TOPROL-XL) 50 MG 24 hr tablet Take 1 tablet (50 mg total) by mouth daily. Take with or immediately following a meal. 03/05/21   Patwardhan, Manish J, MD  nitroGLYCERIN (NITROSTAT) 0.4 MG SL tablet Place 1 tablet (0.4 mg total) under the tongue every 5 (five) minutes x 3 doses as needed for chest pain. 03/05/21   Patwardhan, Reynold Bowen, MD  sacubitril-valsartan (ENTRESTO) 24-26 MG Take 1 tablet by mouth 2 (two) times daily. 03/05/21   Patwardhan, Reynold Bowen, MD  senna-docusate (SENOKOT-S) 8.6-50 MG tablet Take 1 tablet by mouth at bedtime as  needed for mild constipation or moderate constipation. 06/15/21   Margette Fast, MD    Physical Exam: Vitals:   09/19/21 1303 09/19/21 1330 09/19/21 1400 09/19/21 1430  BP: 137/77 (!) 144/95 (!) 148/83 (!) 138/94  Pulse: 97 88 94 87  Resp: (!) 22 (!) 23 19 (!) 26  Temp:      TempSrc:      SpO2: 94% 97% 96% 94%    Constitutional: NAD, calm, comfortable Vitals:   09/19/21 1303 09/19/21 1330 09/19/21 1400 09/19/21 1430  BP: 137/77 (!) 144/95 (!) 148/83 (!) 138/94  Pulse: 97 88 94 87  Resp: (!) 22 (!) 23 19 (!) 26  Temp:      TempSrc:      SpO2: 94% 97% 96% 94%   Eyes: PERRL, lids and conjunctivae normal ENMT: Mucous membranes are moist. Posterior pharynx clear of any exudate or lesions.Normal dentition.  Neck: normal, supple, no masses, no thyromegaly Respiratory: clear to auscultation bilaterally, no wheezing, no crackles. Normal respiratory effort. No accessory muscle use.  Cardiovascular: Regular rate and rhythm, no murmurs / rubs / gallops. No extremity edema. 2+ pedal pulses. No carotid bruits.  Abdomen: no tenderness, lower abdomen nontender and hard masses palpated. No hepatosplenomegaly. Bowel sounds positive.  Musculoskeletal: no clubbing / cyanosis. No joint deformity upper and lower extremities. Good ROM, no contractures. Normal muscle tone.  Skin: no rashes, lesions, ulcers. No induration Neurologic: CN 2-12 grossly intact. Sensation intact, DTR normal. Strength 5/5 in all 4.  Psychiatric: Normal judgment and insight. Alert and oriented x 3. Normal mood.     Labs on Admission: I have personally reviewed following labs and imaging studies  CBC: Recent Labs  Lab 09/19/21 0818  WBC 10.7*  NEUTROABS 9.7*  HGB 9.9*  HCT 31.9*  MCV 86.2  PLT 607   Basic Metabolic Panel: Recent Labs  Lab 09/19/21 0818  NA 135  K 3.7  CL 99  CO2 23  GLUCOSE 189*  BUN 15  CREATININE 0.81  CALCIUM 9.7   GFR: CrCl cannot be calculated (Unknown ideal weight.). Liver  Function Tests: Recent Labs  Lab 09/19/21 0818  AST 28  ALT 15  ALKPHOS 105  BILITOT 0.7  PROT 6.8  ALBUMIN 3.8   Recent Labs  Lab 09/19/21 0818  LIPASE 24   No results for input(s): AMMONIA in the last 168 hours. Coagulation Profile: Recent Labs  Lab 09/19/21 1345  INR 1.2   Cardiac Enzymes: No results for input(s): CKTOTAL, CKMB, CKMBINDEX, TROPONINI in the last 168 hours. BNP (last 3 results) Recent Labs    04/12/21 1111  PROBNP 1,725*   HbA1C: No results for input(s): HGBA1C in the last 72 hours. CBG: No results for input(s): GLUCAP in the last 168 hours. Lipid Profile: No results for input(s): CHOL, HDL, LDLCALC, TRIG, CHOLHDL, LDLDIRECT in the last 72 hours. Thyroid Function Tests: No results  for input(s): TSH, T4TOTAL, FREET4, T3FREE, THYROIDAB in the last 72 hours. Anemia Panel: No results for input(s): VITAMINB12, FOLATE, FERRITIN, TIBC, IRON, RETICCTPCT in the last 72 hours. Urine analysis:    Component Value Date/Time   COLORURINE AMBER (A) 09/19/2021 1000   APPEARANCEUR HAZY (A) 09/19/2021 1000   LABSPEC 1.020 09/19/2021 1000   PHURINE 5.0 09/19/2021 1000   GLUCOSEU NEGATIVE 09/19/2021 1000   HGBUR NEGATIVE 09/19/2021 1000   BILIRUBINUR NEGATIVE 09/19/2021 1000   KETONESUR 5 (A) 09/19/2021 1000   PROTEINUR 100 (A) 09/19/2021 1000   UROBILINOGEN 1.0 12/20/2007 1035   NITRITE NEGATIVE 09/19/2021 1000   LEUKOCYTESUR TRACE (A) 09/19/2021 1000    Radiological Exams on Admission: DG Chest 2 View  Result Date: 09/19/2021 CLINICAL DATA:  Shortness of breath. Patient is on palliative care for ovarian cancer. EXAM: CHEST - 2 VIEW COMPARISON:  Chest radiograph 01/27/2021; CT chest 04/19/2021 FINDINGS: Trace pleural fluid versus pleural thickening at the costophrenic angle with right basilar subsegmental atelectasis, similar to 01/27/2021. No new focal consolidation. No large volume pleural effusion. No pneumothorax. Heart is normal in size. No acute  osseous abnormality. IMPRESSION: No acute cardiopulmonary abnormality. Trace pleural fluid versus pleural thickening at the costophrenic angles with right subsegmental basilar atelectasis, similar to prior exam 01/27/2021. Electronically Signed   By: Ileana Roup M.D.   On: 09/19/2021 09:03   CT Angio Chest PE W/Cm &/Or Wo Cm  Result Date: 09/19/2021 CLINICAL DATA:  Chest pain or shortness of breath, pleurisy or effusion suspected. Acute nonlocalized worsening chronic abdominal pain. Clinical concern for pulmonary embolism. EXAM: CT ANGIOGRAPHY CHEST CT ABDOMEN AND PELVIS WITH CONTRAST TECHNIQUE: Multidetector CT imaging of the chest was performed using the standard protocol during bolus administration of intravenous contrast. Multiplanar CT image reconstructions and MIPs were obtained to evaluate the vascular anatomy. Multidetector CT imaging of the abdomen and pelvis was performed using the standard protocol during bolus administration of intravenous contrast. CONTRAST:  163mL OMNIPAQUE IOHEXOL 350 MG/ML SOLN COMPARISON:  CTs of the chest, abdomen and pelvis 04/19/2021. FINDINGS: CTA CHEST FINDINGS Cardiovascular: The pulmonary arteries are well opacified with contrast to the level of the subsegmental branches. There is no evidence of acute pulmonary embolism. There is limited opacification of the systemic arteries which demonstrate no gross acute abnormality. There is mild atherosclerosis of the aorta, great vessels and coronary arteries. The heart size is normal. There is no pericardial effusion. Mediastinum/Nodes: Anasarca with generalized subcutaneous and mediastinal edema. There is ill-defined fluid tracking superiorly from the abdomen into the retrocrural space. No discrete enlarged mediastinal, hilar or axillary lymph nodes are seen. The thyroid gland, trachea and esophagus demonstrate no significant findings. Lungs/Pleura: New small bilateral pleural effusions. There are new streaky opacities at both  lung bases which are most consistent with atelectasis. In addition, there are patchy ground-glass opacities elsewhere, greatest in the right upper lobe, which may reflect edema. No confluent airspace opacity, dominant mass or suspicious pulmonary nodularity identified. Musculoskeletal/Chest wall: No chest wall mass or suspicious osseous findings. Grossly stable degenerative changes in the spine and both shoulders. CT ABDOMEN AND PELVIS FINDINGS Hepatobiliary: The liver is normal in density without suspicious focal abnormality. There is extrinsic mass effect on the right hepatic lobe by the enlarging intra-masses described below. Small gallstones are noted. No evidence of gallbladder wall thickening or significant biliary dilatation. Pancreas: Displaced by the intra-abdominal masses. No pancreatic mass or ductal dilatation demonstrated. Spleen: Normal in size without focal abnormality. Adrenals/Urinary Tract: As before,  the adrenal glands are not well visualized. There is persistent bilateral hydronephrosis with delayed contrast excretion, similar to previous study. 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 There is a chronic obstructing 1 cm calculus at the left ureteropelvic junction, similar to previous study. The bladder appears decompressed and partly obscured by artifact from the right total hip arthroplasty. No bladder abnormality identified. Stomach/Bowel: No enteric contrast administered. There is peripheral displacement of bowel by the multiple enlarging intra-abdominal masses. The stomach appears unremarkable for its degree of distension. No evidence of bowel wall thickening, distention or surrounding inflammatory change. Vascular/Lymphatic: Bulky adenopathy again noted in the upper retroperitoneum with a retroaortic nodal mass measuring up to 4.9 x 3.5 cm on image 31/4. These nodal masses have mildly progressed. No acute vascular findings are seen. The  portal, superior mesenteric and splenic veins appear patent. There is aortic and branch vessel atherosclerosis. Reproductive: Status post hysterectomy. Other: There are multiple enlarging complex intra-abdominal masses. A predominantly solid lesion in the left mid abdomen measures 17.0 x 14.7 cm on image 51/4 (previously 14.5 x 12.8 cm). A more complex cystic appearing lesion in the right mid abdomen measures 18.4 x 11.0 cm on image 34/4 (previously 17.8 x 11.5 cm). Multiple additional complex solid and cystic lesions have also mildly enlarged. There is mild ascites. There is progressive generalized anasarca with edema throughout the subcutaneous and deep fat. Musculoskeletal: No apparent osseous metastases or acute osseous findings. Status post right total hip arthroplasty. Multilevel lumbar spondylosis is grossly unchanged. Review of the MIP images confirms the above findings. IMPRESSION: 1. No evidence of acute pulmonary embolism. 2. Anasarca with generalized soft tissue edema, new pleural effusions and mildly increased abdominal ascites. 3. Further progression in multiple complex intra-abdominal and retroperitoneal masses consistent with progressive widely metastatic ovarian cancer as correlated with previous radiology reports. No definite thoracic metastatic disease. 4. Increased atelectasis at both lung bases with patchy ground-glass opacities in both lungs which may reflect edema. 5. Chronic bilateral hydronephrosis with chronic obstructing proximal left ureteral calculus. 6. Cholelithiasis. Electronically Signed   By: Richardean Sale M.D.   On: 09/19/2021 12:34   CT Abdomen Pelvis W Contrast  Result Date: 09/19/2021 CLINICAL DATA:  Chest pain or shortness of breath, pleurisy or effusion suspected. Acute nonlocalized worsening chronic abdominal pain. Clinical concern for pulmonary embolism. EXAM: CT ANGIOGRAPHY CHEST CT ABDOMEN AND PELVIS WITH CONTRAST TECHNIQUE: Multidetector CT imaging of the chest was  performed using the standard protocol during bolus administration of intravenous contrast. Multiplanar CT image reconstructions and MIPs were obtained to evaluate the vascular anatomy. Multidetector CT imaging of the abdomen and pelvis was performed using the standard protocol during bolus administration of intravenous contrast. CONTRAST:  149mL OMNIPAQUE IOHEXOL 350 MG/ML SOLN COMPARISON:  CTs of the chest, abdomen and pelvis 04/19/2021. FINDINGS: CTA CHEST FINDINGS Cardiovascular: The pulmonary arteries are well opacified with contrast to the level of the subsegmental branches. There is no evidence of acute pulmonary embolism. There is limited opacification of the systemic arteries which demonstrate no gross acute abnormality. There is mild atherosclerosis of the aorta, great vessels and coronary arteries. The heart size is normal. There is no pericardial effusion. Mediastinum/Nodes: Anasarca with generalized subcutaneous and mediastinal edema. There is ill-defined fluid tracking superiorly from the abdomen into the retrocrural space. No discrete enlarged mediastinal, hilar or axillary lymph nodes are seen. The thyroid gland, trachea and esophagus demonstrate no significant findings. Lungs/Pleura: New small bilateral pleural effusions. There  are new streaky opacities at both lung bases which are most consistent with atelectasis. In addition, there are patchy ground-glass opacities elsewhere, greatest in the right upper lobe, which may reflect edema. No confluent airspace opacity, dominant mass or suspicious pulmonary nodularity identified. Musculoskeletal/Chest wall: No chest wall mass or suspicious osseous findings. Grossly stable degenerative changes in the spine and both shoulders. CT ABDOMEN AND PELVIS FINDINGS Hepatobiliary: The liver is normal in density without suspicious focal abnormality. There is extrinsic mass effect on the right hepatic lobe by the enlarging intra-masses described below. Small  gallstones are noted. No evidence of gallbladder wall thickening or significant biliary dilatation. Pancreas: Displaced by the intra-abdominal masses. No pancreatic mass or ductal dilatation demonstrated. Spleen: Normal in size without focal abnormality. Adrenals/Urinary Tract: As before, the adrenal glands are not well visualized. There is persistent bilateral hydronephrosis with delayed contrast excretion, similar to previous study. 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 There is a chronic obstructing 1 cm calculus at the left ureteropelvic junction, similar to previous study. The bladder appears decompressed and partly obscured by artifact from the right total hip arthroplasty. No bladder abnormality identified. Stomach/Bowel: No enteric contrast administered. There is peripheral displacement of bowel by the multiple enlarging intra-abdominal masses. The stomach appears unremarkable for its degree of distension. No evidence of bowel wall thickening, distention or surrounding inflammatory change. Vascular/Lymphatic: Bulky adenopathy again noted in the upper retroperitoneum with a retroaortic nodal mass measuring up to 4.9 x 3.5 cm on image 31/4. These nodal masses have mildly progressed. No acute vascular findings are seen. The portal, superior mesenteric and splenic veins appear patent. There is aortic and branch vessel atherosclerosis. Reproductive: Status post hysterectomy. Other: There are multiple enlarging complex intra-abdominal masses. A predominantly solid lesion in the left mid abdomen measures 17.0 x 14.7 cm on image 51/4 (previously 14.5 x 12.8 cm). A more complex cystic appearing lesion in the right mid abdomen measures 18.4 x 11.0 cm on image 34/4 (previously 17.8 x 11.5 cm). Multiple additional complex solid and cystic lesions have also mildly enlarged. There is mild ascites. There is progressive generalized anasarca with edema throughout the  subcutaneous and deep fat. Musculoskeletal: No apparent osseous metastases or acute osseous findings. Status post right total hip arthroplasty. Multilevel lumbar spondylosis is grossly unchanged. Review of the MIP images confirms the above findings. IMPRESSION: 1. No evidence of acute pulmonary embolism. 2. Anasarca with generalized soft tissue edema, new pleural effusions and mildly increased abdominal ascites. 3. Further progression in multiple complex intra-abdominal and retroperitoneal masses consistent with progressive widely metastatic ovarian cancer as correlated with previous radiology reports. No definite thoracic metastatic disease. 4. Increased atelectasis at both lung bases with patchy ground-glass opacities in both lungs which may reflect edema. 5. Chronic bilateral hydronephrosis with chronic obstructing proximal left ureteral calculus. 6. Cholelithiasis. Electronically Signed   By: Richardean Sale M.D.   On: 09/19/2021 12:34   VAS [place] LOWER EXTREMITY VENOUS (DVT) (ONLY [facility] & [facility])  Result Date: 09/19/2021  Lower Venous DVT Study Patient Name:  Jennifer Watkins  Date of Exam:   09/19/2021 Medical Rec #: [id number]        Accession #:    [id number] Date of Birth: September 04, 1946         Patient Gender: F Patient Age:   66 years Exam Location:  [facility] Procedure:      VAS [place] LOWER EXTREMITY VENOUS (DVT) Referring Phys: RILEY RANSOM --------------------------------------------------------------------------------  Indications: Swelling, and Edema.  Comparison Study: no prior Performing Technologist: Archie Patten RVS  Examination Guidelines: A complete evaluation includes B-mode imaging, spectral Doppler, color Doppler, and power Doppler as needed of all accessible portions of each vessel. Bilateral testing is considered an integral part of a complete examination. Limited examinations for reoccurring indications may be performed as noted. The reflux portion of the exam is performed with the patient  in reverse Trendelenburg.  +-----+---------------+---------+-----------+----------+--------------+ RIGHTCompressibilityPhasicitySpontaneityPropertiesThrombus Aging +-----+---------------+---------+-----------+----------+--------------+ CFV  Full           Yes      Yes                                 +-----+---------------+---------+-----------+----------+--------------+   +---------+---------------+---------+-----------+----------+--------------+ LEFT     CompressibilityPhasicitySpontaneityPropertiesThrombus Aging +---------+---------------+---------+-----------+----------+--------------+ CFV      Full           Yes      Yes                                 +---------+---------------+---------+-----------+----------+--------------+ SFJ      Full                                                        +---------+---------------+---------+-----------+----------+--------------+ FV Prox  Full                                                        +---------+---------------+---------+-----------+----------+--------------+ FV Mid   Full                                                        +---------+---------------+---------+-----------+----------+--------------+ FV DistalFull                                                        +---------+---------------+---------+-----------+----------+--------------+ PFV      Full                                                        +---------+---------------+---------+-----------+----------+--------------+ POP      Full           Yes      Yes                                 +---------+---------------+---------+-----------+----------+--------------+ PTV      Full                                                        +---------+---------------+---------+-----------+----------+--------------+  Summary: RIGHT: - No evidence of common femoral vein obstruction.  LEFT: - There is no evidence of deep vein  thrombosis in the lower extremity.  - No cystic structure found in the popliteal fossa.  *See table(s) above for measurements and observations. Electronically signed by Deitra Mayo MD on 09/19/2021 at 1:30:45 PM.    Final     EKG: Independently reviewed. Sinus tachy  Assessment/Plan Active Problems:   ACS (acute coronary syndrome) (HCC)   NSTEMI (non-ST elevated myocardial infarction) ([place])  (please populate well all problems here in Problem List. (For example, if patient is on BP meds at home and you resume or decide to hold them, it is a problem that needs to be her. Same for CAD, COPD, HLD and so on)  NSTEMI with baseline CAD and Lcx occlusion -As per cardiology, conservative management -Heparin drip x2 days. -Continue aspirin Plavix and statin. -Increase metoprolol to tartrate heart rate around 60s. -Hold Entresto/ARB today due to patient received IV contrast. -Echo in AM.  Chronic systolic CHF -Euvolemic, hold Lasix and Entresto today due to patient received IV contrast.  Restart both tomorrow.  Metastatic ovarian cancer -Continue Vicodin for pain control, outpatient palliative care follow-up.  Chronic normocytic anemia -H&H stable, likely secondary to chronic illness/cancer/CHF.  DVT prophylaxis: On heparin drip Code Status: DNR Family Communication: No family member at bedside Disposition Plan: Expect more than 2 midnight hospital stay, discharged after 2 days of heparin drip. Consults called: Cardiology Dr. Einar Gip Admission status: Telemetry admission   Lequita Halt MD [facility] Pager [phone]  09/19/2021, 2:55 PM

## 2021-09-19 NOTE — ED Notes (Signed)
Pt transported to CT ?

## 2021-09-19 NOTE — ED Triage Notes (Signed)
Pt is on palliative care for ovarian cancer.  She normally takes Vicodin for pain and was switched to oxycontin yesterday.  Reports SOB and nausea/vomiting since starting the Oxycontin.  Sats 88%.  Reports LLQ pain.

## 2021-09-19 NOTE — ED Notes (Signed)
Patient transported to Ultrasound 

## 2021-09-19 NOTE — ED Notes (Signed)
EDP Hong made aware of critical troponin 813 ng/L. Instructed to not obtain anymore serial troponins.

## 2021-09-19 NOTE — ED Provider Notes (Addendum)
Emergency Medicine Provider Triage Evaluation Note  Jennifer Watkins , a 75 y.o. female  was evaluated in triage.  Pt complains of sharp, stabbing, cramping, diffuse abdominal pain for the past 24 hours.  Patient reports that she has known GI malignancy that is nonoperable and is on palliative care.  She was experiencing the abdominal pain and then took an OxyContin, and then she developed nausea, vomiting, and shortness of breath.  She denies any chest pain, diaphoresis, lightheadedness, or weakness.  She denies any constipation or diarrhea.  Last BM yesterday morning.  Patient is still flatulent.  Review of Systems  Positive: Abdominal pain, nausea, vomiting Negative: Constipation, diarrhea, fevers, chest pain, diaphoresis, lightheadedness, weakness  Physical Exam  BP (!) 161/95 (BP Location: Right Arm)   Pulse (!) 103   Temp (!) 97.4 F (36.3 C) (Oral)   Resp 20   SpO2 93%  Gen:   Awake, no distress, uncomfortable Resp:  Normal effort, patient is satting 88% and was put on 2 L [place] MSK:   Moves extremities without difficulty  Other:  Bilateral leg edema left greater than right, palpable mass in abdomen, no guarding or rebound  Medical Decision Making  Medically screening exam initiated at 8:12 AM.  Appropriate orders placed.  Deija Buhrman was informed that the remainder of the evaluation will be completed by another provider, this initial triage assessment does not replace that evaluation, and the importance of remaining in the ED until their evaluation is complete.  Due to patient's low oxygenation status, she was placed on 2 L nasal cannula and is satting appropriately.  Labs ordered.   Sherrell Puller, PA-C 09/19/21 0816    Sherrell Puller, PA-C 09/19/21 6754    Luna Fuse, MD 09/19/21 [phone]

## 2021-09-19 NOTE — ED Notes (Signed)
Pt placed on 2L of O2 due to 88%sat . Triage RN is aware.

## 2021-09-19 NOTE — Progress Notes (Signed)
ANTICOAGULATION CONSULT NOTE - Initial Consult  Pharmacy Consult for Heparin Indication: chest pain/ACS  No Known Allergies  Patient Measurements:   Heparin Dosing Weight: 66 kg  Vital Signs: Temp: 97.4 F (36.3 C) (10/23 0800) Temp Source: Oral (10/23 0800) BP: 137/77 (10/23 1303) Pulse Rate: 97 (10/23 1303)  Labs: Recent Labs    09/19/21 0818 09/19/21 1019 09/19/21 1136  HGB 9.9*  --   --   HCT 31.9*  --   --   PLT 262  --   --   CREATININE 0.81  --   --   TROPONINIHS 654* 826* 813*    CrCl cannot be calculated (Unknown ideal weight.).   Medical History: Past Medical History:  Diagnosis Date   Hyperlipidemia    Myocardial infarction ([place]) 0302/2022   Ovarian cancer ([place])    ovarian    Medications:  (Not in a hospital admission)  Scheduled:   aspirin  325 mg Oral Daily   Infusions:  PRN:   Assessment: 46 yof with a history of MI, HLD, and ovarian cancer. Patient is presenting with increased abdominal pain and shortness of breath. Heparin per pharmacy consult placed for chest pain/ACS.  Patient is on not on anticoagulation prior to arrival.  Hgb 9.9; plt 262 D-Dimer 3.08  Goal of Therapy:  Heparin level 0.3-0.7 units/ml Monitor platelets by anticoagulation protocol: Yes   Plan:  No bolus Start heparin infusion at 800 units/hr Check anti-Xa level in 8 hours and daily while on heparin Continue to monitor H&H and platelets  Lorelei Pont, PharmD, BCPS 09/19/2021 1:40 PM ED Clinical Pharmacist -  [phone]

## 2021-09-20 ENCOUNTER — Other Ambulatory Visit (HOSPITAL_COMMUNITY): Payer: Medicare Other

## 2021-09-20 DIAGNOSIS — I214 Non-ST elevation (NSTEMI) myocardial infarction: Secondary | ICD-10-CM | POA: Diagnosis not present

## 2021-09-20 DIAGNOSIS — G893 Neoplasm related pain (acute) (chronic): Secondary | ICD-10-CM | POA: Diagnosis present

## 2021-09-20 LAB — BASIC METABOLIC PANEL
Anion gap: 8 (ref 5–15)
BUN: 16 mg/dL (ref 8–23)
CO2: 23 mmol/L (ref 22–32)
Calcium: 8.7 mg/dL — ABNORMAL LOW (ref 8.9–10.3)
Chloride: 98 mmol/L (ref 98–111)
Creatinine, Ser: 0.81 mg/dL (ref 0.44–1.00)
GFR, Estimated: >60 mL/min (ref >60)
Glucose, Bld: 144 mg/dL — ABNORMAL HIGH (ref 70–99)
Potassium: 4.1 mmol/L (ref 3.5–5.1)
Sodium: 129 mmol/L — ABNORMAL LOW (ref 135–145)

## 2021-09-20 LAB — CBC
HCT: 33.3 % — ABNORMAL LOW (ref 36.0–46.0)
Hemoglobin: 10.5 g/dL — ABNORMAL LOW (ref 12.0–15.0)
MCH: 26.9 pg (ref 26.0–34.0)
MCHC: 31.5 g/dL (ref 30.0–36.0)
MCV: 85.2 fL (ref 80.0–100.0)
Platelets: 209 10*3/uL (ref 150–400)
RBC: 3.91 MIL/uL (ref 3.87–5.11)
RDW: 14.8 % (ref 11.5–15.5)
WBC: 17.3 10*3/uL — ABNORMAL HIGH (ref 4.0–10.5)
nRBC: 0 % (ref 0.0–0.2)

## 2021-09-20 LAB — CK: Total CK: 90 U/L (ref 38–234)

## 2021-09-20 LAB — LIPID PANEL
Cholesterol: 126 mg/dL (ref 0–200)
HDL: 34 mg/dL — ABNORMAL LOW (ref >40)
LDL Cholesterol: 64 mg/dL (ref 0–99)
Total CHOL/HDL Ratio: 3.7 RATIO
Triglycerides: 141 mg/dL (ref <150)
VLDL: 28 mg/dL (ref 0–40)

## 2021-09-20 LAB — TROPONIN I (HIGH SENSITIVITY): Troponin I (High Sensitivity): 1027 ng/L (ref <18)

## 2021-09-20 MED ORDER — SODIUM CHLORIDE 0.9 % IV SOLN
12.5000 mg | Freq: Four times a day (QID) | INTRAVENOUS | Status: DC | PRN
  Administered 2021-09-20: 12.5 mg via INTRAVENOUS
  Filled 2021-09-20: qty 0.5

## 2021-09-20 MED ORDER — HYDROMORPHONE HCL 1 MG/ML IJ SOLN
0.5000 mg | INTRAMUSCULAR | Status: DC | PRN
Start: 2021-09-20 — End: 2021-09-24
  Administered 2021-09-20 – 2021-09-23 (×19): 0.5 mg via INTRAVENOUS
  Filled 2021-09-20 (×20): qty 0.5

## 2021-09-20 NOTE — ED Notes (Signed)
Patient awake, continues with nausea.  MD notified.

## 2021-09-20 NOTE — ED Notes (Signed)
Breakfast Orders placed 

## 2021-09-20 NOTE — Progress Notes (Signed)
[place] [phone] Manufacturing engineer [facility]) Hospital Liaison note:  This patient is currently enrolled in [facility] outpatient-based Palliative Care. Will continue to follow for disposition.  Please call with any outpatient palliative questions or concerns.  Thank you, Lorelee Market, LPN [facility] Liaison [phone]

## 2021-09-20 NOTE — Consult Note (Signed)
CARDIOLOGY CONSULT NOTE  Patient ID: Jennifer Watkins MRN: [id number] DOB/AGE: 73/07/1946 73 y.o.  Admit date: 09/19/2021 Referring Physician  Wynetta Fines, MD Primary Physician:  Gayland Curry, DO Reason for Consultation  NSTEMI  Patient ID: Jennifer Watkins, female    DOB: [birth year], 75 y.o.   MRN: [id number]  CC:  Abdominal discomfort  HPI:    Jennifer Watkins  is a 75 y.o. 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  She was ruled out for pulmonary embolism in the emergency room by CT scan and cardiac markers were markedly elevated without EKG abnormality, I was consulted for management of NSTEMI.  Patient presented with nausea, abdominal discomfort but denies any chest pain or shortness of breath.  States that she did not have a heart attack.  Upon questioning, her presentation in March 2022 when she presented with NSTEMI was also with nausea, this could potentially be unstable angina chronic.  Today she is not having any significant abdominal discomfort.  States that she slept well and she has a good appetite.  Denies dyspnea, no PND or orthopnea, no leg edema.  Past Medical History:  Diagnosis Date   Hyperlipidemia    Myocardial infarction ([place]) 0302/2022   Ovarian cancer ([place])    ovarian   Past Surgical History:  Procedure Laterality Date   LEFT HEART CATH AND CORONARY ANGIOGRAPHY N/A 01/28/2021   Procedure: LEFT HEART CATH AND CORONARY ANGIOGRAPHY;  Surgeon: Nigel Mormon, MD;   Location: [place] [facility];  Service: Cardiovascular;  Laterality: N/A;   TOTAL ABDOMINAL HYSTERECTOMY W/ BILATERAL SALPINGOOPHORECTOMY     TOTAL HIP ARTHROPLASTY     Social History   Tobacco Use   Smoking status: Never   Smokeless tobacco: Never  Substance Use Topics   Alcohol use: Not Currently    Family History  Problem Relation Age of Onset   Heart Problems Mother    Breast cancer Mother        dx after age 25   Heart Problems Father    Non-Hodgkin's lymphoma Father    Prostate cancer Brother        dx after age 59; no mets   Leukemia Sister 38   Ovarian cancer Neg Hx    Uterine cancer Neg Hx     Marital Status: Single  ROS  Review of Systems  HENT: Negative.    Cardiovascular:  Negative for chest pain, dyspnea on exertion and leg swelling.  Endocrine: Negative.   Gastrointestinal:  Positive for bloating, abdominal pain and nausea. Negative for constipation and melena.  Neurological: Negative.   All other systems reviewed and are negative. Objective   Vitals with BMI 09/20/2021 09/20/2021 09/20/2021  Height - - -  Weight - - -  BMI - - -  Systolic 500 - 938  Diastolic 73 - 76  Pulse 90 93 96    Blood pressure 131/73, pulse 90, temperature 99 F (37.2 C), temperature source Oral, resp. rate 19, SpO2 96 %.    Physical Exam Constitutional:      Appearance: She is normal weight.  Eyes:  Extraocular Movements: Extraocular movements intact.  Neck:     Vascular: No carotid bruit or JVD.  Cardiovascular:     Rate and Rhythm: Normal rate and regular rhythm.     Pulses: Intact distal pulses.     Heart sounds: Normal heart sounds. No murmur heard.   No gallop.  Pulmonary:     Effort: Pulmonary effort is normal.     Breath sounds: Normal breath sounds.  Abdominal:     General: Bowel sounds are normal. There is distension.     Palpations: There is shifting dullness and mass (diffuse hard mass).     Tenderness: There is no guarding.  Musculoskeletal:         General: No swelling.  Skin:    General: Skin is warm.  Neurological:     General: No focal deficit present.  Psychiatric:        Mood and Affect: Mood normal.   Laboratory examination:   Recent Labs    06/15/21 1956 09/19/21 0818 09/20/21 0418  NA 135 135 129*  K 3.6 3.7 4.1  CL 100 99 98  CO2 25 23 23   GLUCOSE 166* 189* 144*  BUN 11 15 16   CREATININE 0.52 0.81 0.81  CALCIUM 9.1 9.7 8.7*  GFRNONAA >60 >60 >60   CrCl cannot be calculated (Unknown ideal weight.).  CMP Latest Ref Rng & Units 09/20/2021 09/19/2021 06/15/2021  Glucose 70 - 99 mg/dL 144(H) 189(H) 166(H)  BUN 8 - 23 mg/dL 16 15 11   Creatinine 0.44 - 1.00 mg/dL 0.81 0.81 0.52  Sodium 135 - 145 mmol/L 129(L) 135 135  Potassium 3.5 - 5.1 mmol/L 4.1 3.7 3.6  Chloride 98 - 111 mmol/L 98 99 100  CO2 22 - 32 mmol/L 23 23 25   Calcium 8.9 - 10.3 mg/dL 8.7(L) 9.7 9.1  Total Protein 6.5 - 8.1 g/dL - 6.8 6.4(L)  Total Bilirubin 0.3 - 1.2 mg/dL - 0.7 0.5  Alkaline Phos 38 - 126 U/L - 105 79  AST 15 - 41 U/L - 28 18  ALT 0 - 44 U/L - 15 16   CBC Latest Ref Rng & Units 09/20/2021 09/19/2021 06/15/2021  WBC 4.0 - 10.5 K/uL 17.3(H) 10.7(H) 6.4  Hemoglobin 12.0 - 15.0 g/dL 10.5(L) 9.9(L) 10.1(L)  Hematocrit 36.0 - 46.0 % 33.3(L) 31.9(L) 30.9(L)  Platelets 150 - 400 K/uL 209 262 147(L)   Lipid Panel Recent Labs    01/27/21 1821 02/09/21 1505 02/16/21 1557 07/06/21 0911 09/20/21 0945  CHOL 240*   < > 150 142 126  TRIG 128   < > 106 119 141  LDLCALC 169*   < > 98 84 64  VLDL 26  --   --   --  28  HDL 45   < > 32* 36* 34*  CHOLHDL 5.3   < > 4.7* 3.9 3.7   < > = values in this interval not displayed.    HEMOGLOBIN A1C No results found for: HGBA1C, MPG TSH No results for input(s): TSH in the last 8760 hours. BNP (last 3 results) Recent Labs    01/27/21 1821  BNP 659.7*    Cardiac Panel (last 3 results) Recent Labs    09/19/21 1019 09/19/21 1136 09/20/21 0500  CKTOTAL  --  90  --   TROPONINIHS 826*  813* 1,027*     Medications and allergies  No Known Allergies   Current Meds  Medication Sig   ASPIRIN LOW DOSE 81 MG EC tablet TAKE 1 TABLET(81  MG) BY MOUTH DAILY. SWALLOW WHOLE (Patient taking differently: Take 81 mg by mouth daily.)   atorvastatin (LIPITOR) 80 MG tablet Take 1 tablet (80 mg total) by mouth daily at 6 PM.   clopidogrel (PLAVIX) 75 MG tablet Take 75 mg by mouth daily.   furosemide (LASIX) 40 MG tablet Take 40 mg by mouth daily.   HYDROcodone-acetaminophen (NORCO/VICODIN) 5-325 MG tablet Take 1 tablet by mouth every 6 (six) hours as needed for severe pain.   metoprolol succinate (TOPROL-XL) 50 MG 24 hr tablet Take 1 tablet (50 mg total) by mouth daily. Take with or immediately following a meal.   nitroGLYCERIN (NITROSTAT) 0.4 MG SL tablet Place 1 tablet (0.4 mg total) under the tongue every 5 (five) minutes x 3 doses as needed for chest pain.   sacubitril-valsartan (ENTRESTO) 24-26 MG Take 1 tablet by mouth 2 (two) times daily.   senna-docusate (SENOKOT-S) 8.6-50 MG tablet Take 1 tablet by mouth at bedtime as needed for mild constipation or moderate constipation.    Scheduled Meds:  aspirin  325 mg Oral Daily   atorvastatin  80 mg Oral q1800   clopidogrel  75 mg Oral Daily   furosemide  40 mg Oral Daily   heparin injection (subcutaneous)  5,000 Units Subcutaneous Q12H   metoprolol tartrate  37.5 mg Oral BID   sacubitril-valsartan  1 tablet Oral BID   Continuous Infusions:  promethazine (PHENERGAN) injection (IM or IVPB) Stopped (09/20/21 0301)   PRN Meds:.HYDROcodone-acetaminophen, HYDROmorphone (DILAUDID) injection, ondansetron **OR** ondansetron (ZOFRAN) IV, promethazine (PHENERGAN) injection (IM or IVPB), senna-docusate   No intake/output data recorded. Total I/O In: -  Out: 100 [Urine:100]    Radiology:   CT angiogram of the chest and abdomen 09/19/2021: 1. No evidence of acute pulmonary embolism. 2. Anasarca with generalized soft tissue edema, new  pleural effusions and mildly increased abdominal ascites. 3. Further progression in multiple complex intra-abdominal and retroperitoneal masses consistent with progressive widely metastatic ovarian cancer as correlated with previous radiology reports. No definite thoracic metastatic disease. 4. Increased atelectasis at both lung bases with patchy ground-glass opacities in both lungs which may reflect edema. 5. Chronic bilateral hydronephrosis with chronic obstructing proximal left ureteral calculus. 6. Cholelithiasis.  Cardiac Studies:   Echocardiogram 04/08/2021:  Moderately depressed LV systolic function with visual EF 35-40%. Left  ventricle cavity is normal in size. Doppler evidence of grade II  (pseudonormal) diastolic dysfunction, normal LAP. Left ventricle regional  wall motion findings: Basal anterolateral, Mid anterolateral and Apical  lateral hypokinesis. Basal inferolateral and Mid inferolateral akinesis.  Left atrial cavity is mildly dilated.  Mild (Grade I) aortic regurgitation.  Mild (Grade I) mitral regurgitation.  Mild tricuspid regurgitation. Mild pulmonary hypertension. RVSP measures 37 mmHg.  Prior study dated 01/28/2021: LVEF 40-45%, global hypokinesis, mild LVH, GLS  -5.5%, PASP 49 mmHg, mild MR.    EKG 03/29/2021: Sinus rhythm Nonspecific ST-T changes inferolateral leads     Coronary angiography 01/28/2021: LM: Normal LAD: Mid diffuse 50% disease        Small D2 ostial 70% stenosis LCx: Medium sized high OM1 with proximal 100% thrombotic occlusion        Likely culprit vessel with completed infarct RCA: Small. Mild luminal irregularities   Late presentation with completed infarct from occlusion of high OM1 vessel No benefit from revascularization given late presentation Moderate non-bstructive disease in LAD Normal LVEDP  EKG:  EKG 09/19/2021: Sinus tachycardia at rate of 101 bpm, normal axis, poor R wave progression, cannot exclude anteroseptal  infarct  old.  Nonspecific T abnormality.  Low-voltage complexes.  No significant change compared to prior EKG.  Assessment   1.  Unstable angina pectoris/NSTEMI 2.  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  Metastatic ovarian disease, stage IV 4.  Ischemic cardiomyopathy with chronic systolic heart failure  Recommendations:   Patient completely denies any chest pain or dyspnea.  Upon initial presentation with non-STEMI in March 2022, she had presented again with nausea.  That could be anginal equivalent however it is very difficult to state as patient appears to think that oxycodone made her extremely nauseous and also was not controlling her abdominal discomfort.  Either way in view of underlying malignancy, the fact that she is not in heart failure, she is does not have any active ongoing chest pain, invasive approach is probably not the best.  I also do not feel intravenous heparin would be appropriate in a patient with metastatic abdominal disease/ovarian cancer, with high risk of intraperitoneal bleeding.  Patient is presently on aspirin and Plavix, she remains asymptomatic without clinical evidence of heart failure, would recommend continuing the same for now.  Patient states that she is DNR and would like to pursue palliative care/hospice care and wants to have inpatient/[facility] care.  Please call us if there is any questions regarding cardiac management.   Adrian Prows, MD, [facility] 09/20/2021, 1:01 PM Office: [phone]

## 2021-09-20 NOTE — Progress Notes (Signed)
PROGRESS NOTE    Jennifer Watkins  QBV:[id number] DOB: [birth year] DOA: 09/19/2021 PCP: Gayland Curry, DO    Brief Narrative:  75 year old with history of coronary artery disease, non-STEMI March 2022 with cardiac cath showing 100% occlusion of proximal circumflex on aspirin and Plavix, chronic systolic congestive heart failure on Entresto, metastatic ovarian cancer on chemotherapy on home palliative care, hypertension, hyperlipidemia and chronic bilateral obstructive uropathy/hydronephrosis presented with nausea and vomiting and abdominal pain. Has been on Vicodin at home and ran out.  In the emergency room, CTA negative for PE, chronic bilateral hydronephrosis, troponins elevated [phone].  EKG with no acute ST changes.  Cardiology consulted.  Initially started on heparin drip and admitted to the hospital.  CT scan of the abdomen pelvis consistent with widely metastatic ovarian cancer.   Assessment & Plan:   Active Problems:   ACS (acute coronary syndrome) (HCC)   NSTEMI (non-ST elevated myocardial infarction) ([place])  Non-STEMI with history of coronary artery disease and circumflex occlusion: Currently chest pain-free.  Unlikely acute coronary syndrome. Heparin drip discontinued.  No ongoing chest pain. On aspirin Plavix and statin, on metoprolol.  Resume Entresto.  Echocardiogram today.  Cardiology following. Her symptomatology is likely due to metastatic ovarian cancer.  With her limited life expectancy, unlikely will benefit with any cardiac intervention.  Chronic systolic heart failure: Euvolemic.  Resume Lasix and Entresto.  Metastatic ovarian cancer: Followed by oncology at [facility].  Widely metastatic disease and declined chemotherapy.  Pain control and palliation. Patient is followed by outpatient palliative care.  She is on oxycodone at home.  Her pain is likely due to worsening metastatic disease burden in the abdomen causing episodic intestinal compression.  Patient may  be nearing end-of-life and she is aware. Patient still with significant symptoms. Will consult palliative care in house to help manage her symptoms.  She may even qualify to go home with home hospice. Patient is agreeable to meet with inpatient palliative care team as she is followed by [facility] as outpatient.  Chronic normocytic anemia: Stable.  Hyponatremia: Stable.  Start mobilizing.  Can go to MedSurg bed.  Will need symptom control before discharging home.   DVT prophylaxis: heparin injection 5,000 Units Start: 09/19/21 2200   Code Status: DNR Family Communication: None at the bedside Disposition Plan: Status is: Inpatient  Remains inpatient appropriate because: Significant abdominal pain.  Metastatic cancer pain.         Consultants:  Cardiology  Procedures:  None  Antimicrobials:  None   Subjective: Patient seen and examined.  Still in the ER.  Did not get good sleep.  Denies any chest pain or palpitations or shortness of breath. She has diffuse abdominal pain and bloating, patient knows that she has big tumors in her belly that sometimes shift and causes her to have nausea and vomiting.  Denies any bowel problems.  We discussed about worsening tumor burden on her abdomen causing her to have episodic nausea vomiting and abdominal pain and probably needing more symptom control.  Patient is also focused on achieving comfort and continuing palliative care services at home.  Objective: Vitals:   09/20/21 0500 09/20/21 0530 09/20/21 0600 09/20/21 0630  BP: 107/65 (!) 131/28 127/71 140/76  Pulse: 82 91 91 96  Resp: (!) 25 (!) 25 (!) 26 18  Temp:      TempSrc:      SpO2: 98% 99% 99% 98%   No intake or output data in the 24 hours ending 09/20/21 0915 There  were no vitals filed for this visit.  Examination:  General exam: Appears calm and comfortable  Mildly anxious otherwise looks stable on room air. Respiratory system: Clear to auscultation. Respiratory  effort normal.  No added sounds. Cardiovascular system: S1 & S2 heard, RRR.  No pedal edema. Gastrointestinal system: Soft.  She has palpable firm tumor on the left lateral quadrant of the abdomen.  No definite rigidity or guarding.  No evidence of ascites fluid.  Bowel sounds present. Central nervous system: Alert and oriented. No focal neurological deficits. Extremities: Symmetric 5 x 5 power.    Data Reviewed: I have personally reviewed following labs and imaging studies  CBC: Recent Labs  Lab 09/19/21 0818 09/20/21 0418  WBC 10.7* 17.3*  NEUTROABS 9.7*  --   HGB 9.9* 10.5*  HCT 31.9* 33.3*  MCV 86.2 85.2  PLT 262 297   Basic Metabolic Panel: Recent Labs  Lab 09/19/21 0818 09/20/21 0418  NA 135 129*  K 3.7 4.1  CL 99 98  CO2 23 23  GLUCOSE 189* 144*  BUN 15 16  CREATININE 0.81 0.81  CALCIUM 9.7 8.7*   GFR: CrCl cannot be calculated (Unknown ideal weight.). Liver Function Tests: Recent Labs  Lab 09/19/21 0818  AST 28  ALT 15  ALKPHOS 105  BILITOT 0.7  PROT 6.8  ALBUMIN 3.8   Recent Labs  Lab 09/19/21 0818  LIPASE 24   No results for input(s): AMMONIA in the last 168 hours. Coagulation Profile: Recent Labs  Lab 09/19/21 1345  INR 1.2   Cardiac Enzymes: Recent Labs  Lab 09/19/21 1136  CKTOTAL 90   BNP (last 3 results) Recent Labs    04/12/21 1111  PROBNP 1,725*   HbA1C: No results for input(s): HGBA1C in the last 72 hours. CBG: No results for input(s): GLUCAP in the last 168 hours. Lipid Profile: No results for input(s): CHOL, HDL, LDLCALC, TRIG, CHOLHDL, LDLDIRECT in the last 72 hours. Thyroid Function Tests: No results for input(s): TSH, T4TOTAL, FREET4, T3FREE, THYROIDAB in the last 72 hours. Anemia Panel: No results for input(s): VITAMINB12, FOLATE, FERRITIN, TIBC, IRON, RETICCTPCT in the last 72 hours. Sepsis Labs: No results for input(s): PROCALCITON, LATICACIDVEN in the last 168 hours.  Recent Results (from the past 240  hour(s))  Resp Panel by RT-PCR (Flu A&B, Covid) Nasopharyngeal Swab     Status: None   Collection Time: 09/19/21  9:24 AM   Specimen: Nasopharyngeal Swab; Nasopharyngeal(NP) swabs in vial transport medium  Result Value Ref Range Status   SARS Coronavirus 2 by RT PCR NEGATIVE NEGATIVE Final    Comment: (NOTE) SARS-CoV-2 target nucleic acids are NOT DETECTED.  The SARS-CoV-2 RNA is generally detectable in upper respiratory specimens during the acute phase of infection. The lowest concentration of SARS-CoV-2 viral copies this assay can detect is 138 copies/mL. A negative result does not preclude SARS-Cov-2 infection and should not be used as the sole basis for treatment or other patient management decisions. A negative result may occur with  improper specimen collection/handling, submission of specimen other than nasopharyngeal swab, presence of viral mutation(s) within the areas targeted by this assay, and inadequate number of viral copies(<138 copies/mL). A negative result must be combined with clinical observations, patient history, and epidemiological information. The expected result is Negative.  Fact Sheet for Patients:  EntrepreneurPulse.com.au  Fact Sheet for Healthcare Providers:  IncredibleEmployment.be  This test is no t yet approved or cleared by the [place] FDA and  has been authorized for detection  and/or diagnosis of SARS-CoV-2 by FDA under an Emergency Use Authorization (EUA). This EUA will remain  in effect (meaning this test can be used) for the duration of the COVID-19 declaration under Section 564(b)(1) of the Act, 21 U.S.C.section 360bbb-3(b)(1), unless the authorization is terminated  or revoked sooner.       Influenza A by PCR NEGATIVE NEGATIVE Final   Influenza B by PCR NEGATIVE NEGATIVE Final    Comment: (NOTE) The Xpert Xpress SARS-CoV-2/FLU/RSV plus assay is intended as an aid in the diagnosis of influenza from  Nasopharyngeal swab specimens and should not be used as a sole basis for treatment. Nasal washings and aspirates are unacceptable for Xpert Xpress SARS-CoV-2/FLU/RSV testing.  Fact Sheet for Patients: EntrepreneurPulse.com.au  Fact Sheet for Healthcare Providers: IncredibleEmployment.be  This test is not yet approved or cleared by the [place] FDA and has been authorized for detection and/or diagnosis of SARS-CoV-2 by FDA under an Emergency Use Authorization (EUA). This EUA will remain in effect (meaning this test can be used) for the duration of the COVID-19 declaration under Section 564(b)(1) of the Act, 21 U.S.C. section 360bbb-3(b)(1), unless the authorization is terminated or revoked.  Performed at [facility], [place] [address], [place],  [address]          Radiology Studies: DG Chest 2 View  Result Date: 09/19/2021 CLINICAL DATA:  Shortness of breath. Patient is on palliative care for ovarian cancer. EXAM: CHEST - 2 VIEW COMPARISON:  Chest radiograph 01/27/2021; CT chest 04/19/2021 FINDINGS: Trace pleural fluid versus pleural thickening at the costophrenic angle with right basilar subsegmental atelectasis, similar to 01/27/2021. No new focal consolidation. No large volume pleural effusion. No pneumothorax. Heart is normal in size. No acute osseous abnormality. IMPRESSION: No acute cardiopulmonary abnormality. Trace pleural fluid versus pleural thickening at the costophrenic angles with right subsegmental basilar atelectasis, similar to prior exam 01/27/2021. Electronically Signed   By: Ileana Roup M.D.   On: 09/19/2021 09:03   CT Angio Chest PE W/Cm &/Or Wo Cm  Result Date: 09/19/2021 CLINICAL DATA:  Chest pain or shortness of breath, pleurisy or effusion suspected. Acute nonlocalized worsening chronic abdominal pain. Clinical concern for pulmonary embolism. EXAM: CT ANGIOGRAPHY CHEST CT ABDOMEN AND PELVIS WITH CONTRAST  TECHNIQUE: Multidetector CT imaging of the chest was performed using the standard protocol during bolus administration of intravenous contrast. Multiplanar CT image reconstructions and MIPs were obtained to evaluate the vascular anatomy. Multidetector CT imaging of the abdomen and pelvis was performed using the standard protocol during bolus administration of intravenous contrast. CONTRAST:  134mL OMNIPAQUE IOHEXOL 350 MG/ML SOLN COMPARISON:  CTs of the chest, abdomen and pelvis 04/19/2021. FINDINGS: CTA CHEST FINDINGS Cardiovascular: The pulmonary arteries are well opacified with contrast to the level of the subsegmental branches. There is no evidence of acute pulmonary embolism. There is limited opacification of the systemic arteries which demonstrate no gross acute abnormality. There is mild atherosclerosis of the aorta, great vessels and coronary arteries. The heart size is normal. There is no pericardial effusion. Mediastinum/Nodes: Anasarca with generalized subcutaneous and mediastinal edema. There is ill-defined fluid tracking superiorly from the abdomen into the retrocrural space. No discrete enlarged mediastinal, hilar or axillary lymph nodes are seen. The thyroid gland, trachea and esophagus demonstrate no significant findings. Lungs/Pleura: New small bilateral pleural effusions. There are new streaky opacities at both lung bases which are most consistent with atelectasis. In addition, there are patchy ground-glass opacities elsewhere, greatest in the right upper lobe, which  may reflect edema. No confluent airspace opacity, dominant mass or suspicious pulmonary nodularity identified. Musculoskeletal/Chest wall: No chest wall mass or suspicious osseous findings. Grossly stable degenerative changes in the spine and both shoulders. CT ABDOMEN AND PELVIS FINDINGS Hepatobiliary: The liver is normal in density without suspicious focal abnormality. There is extrinsic mass effect on the right hepatic lobe by the  enlarging intra-masses described below. Small gallstones are noted. No evidence of gallbladder wall thickening or significant biliary dilatation. Pancreas: Displaced by the intra-abdominal masses. No pancreatic mass or ductal dilatation demonstrated. Spleen: Normal in size without focal abnormality. Adrenals/Urinary Tract: As before, the adrenal glands are not well visualized. There is persistent bilateral hydronephrosis with delayed contrast excretion, similar to previous study. 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 There is a chronic obstructing 1 cm calculus at the left ureteropelvic junction, similar to previous study. The bladder appears decompressed and partly obscured by artifact from the right total hip arthroplasty. No bladder abnormality identified. Stomach/Bowel: No enteric contrast administered. There is peripheral displacement of bowel by the multiple enlarging intra-abdominal masses. The stomach appears unremarkable for its degree of distension. No evidence of bowel wall thickening, distention or surrounding inflammatory change. Vascular/Lymphatic: Bulky adenopathy again noted in the upper retroperitoneum with a retroaortic nodal mass measuring up to 4.9 x 3.5 cm on image 31/4. These nodal masses have mildly progressed. No acute vascular findings are seen. The portal, superior mesenteric and splenic veins appear patent. There is aortic and branch vessel atherosclerosis. Reproductive: Status post hysterectomy. Other: There are multiple enlarging complex intra-abdominal masses. A predominantly solid lesion in the left mid abdomen measures 17.0 x 14.7 cm on image 51/4 (previously 14.5 x 12.8 cm). A more complex cystic appearing lesion in the right mid abdomen measures 18.4 x 11.0 cm on image 34/4 (previously 17.8 x 11.5 cm). Multiple additional complex solid and cystic lesions have also mildly enlarged. There is mild ascites. There is progressive  generalized anasarca with edema throughout the subcutaneous and deep fat. Musculoskeletal: No apparent osseous metastases or acute osseous findings. Status post right total hip arthroplasty. Multilevel lumbar spondylosis is grossly unchanged. Review of the MIP images confirms the above findings. IMPRESSION: 1. No evidence of acute pulmonary embolism. 2. Anasarca with generalized soft tissue edema, new pleural effusions and mildly increased abdominal ascites. 3. Further progression in multiple complex intra-abdominal and retroperitoneal masses consistent with progressive widely metastatic ovarian cancer as correlated with previous radiology reports. No definite thoracic metastatic disease. 4. Increased atelectasis at both lung bases with patchy ground-glass opacities in both lungs which may reflect edema. 5. Chronic bilateral hydronephrosis with chronic obstructing proximal left ureteral calculus. 6. Cholelithiasis. Electronically Signed   By: Richardean Sale M.D.   On: 09/19/2021 12:34   CT Abdomen Pelvis W Contrast  Result Date: 09/19/2021 CLINICAL DATA:  Chest pain or shortness of breath, pleurisy or effusion suspected. Acute nonlocalized worsening chronic abdominal pain. Clinical concern for pulmonary embolism. EXAM: CT ANGIOGRAPHY CHEST CT ABDOMEN AND PELVIS WITH CONTRAST TECHNIQUE: Multidetector CT imaging of the chest was performed using the standard protocol during bolus administration of intravenous contrast. Multiplanar CT image reconstructions and MIPs were obtained to evaluate the vascular anatomy. Multidetector CT imaging of the abdomen and pelvis was performed using the standard protocol during bolus administration of intravenous contrast. CONTRAST:  151mL OMNIPAQUE IOHEXOL 350 MG/ML SOLN COMPARISON:  CTs of the chest, abdomen and pelvis 04/19/2021. FINDINGS: CTA CHEST FINDINGS Cardiovascular: The pulmonary arteries are  well opacified with contrast to the level of the subsegmental branches. There  is no evidence of acute pulmonary embolism. There is limited opacification of the systemic arteries which demonstrate no gross acute abnormality. There is mild atherosclerosis of the aorta, great vessels and coronary arteries. The heart size is normal. There is no pericardial effusion. Mediastinum/Nodes: Anasarca with generalized subcutaneous and mediastinal edema. There is ill-defined fluid tracking superiorly from the abdomen into the retrocrural space. No discrete enlarged mediastinal, hilar or axillary lymph nodes are seen. The thyroid gland, trachea and esophagus demonstrate no significant findings. Lungs/Pleura: New small bilateral pleural effusions. There are new streaky opacities at both lung bases which are most consistent with atelectasis. In addition, there are patchy ground-glass opacities elsewhere, greatest in the right upper lobe, which may reflect edema. No confluent airspace opacity, dominant mass or suspicious pulmonary nodularity identified. Musculoskeletal/Chest wall: No chest wall mass or suspicious osseous findings. Grossly stable degenerative changes in the spine and both shoulders. CT ABDOMEN AND PELVIS FINDINGS Hepatobiliary: The liver is normal in density without suspicious focal abnormality. There is extrinsic mass effect on the right hepatic lobe by the enlarging intra-masses described below. Small gallstones are noted. No evidence of gallbladder wall thickening or significant biliary dilatation. Pancreas: Displaced by the intra-abdominal masses. No pancreatic mass or ductal dilatation demonstrated. Spleen: Normal in size without focal abnormality. Adrenals/Urinary Tract: As before, the adrenal glands are not well visualized. There is persistent bilateral hydronephrosis with delayed contrast excretion, similar to previous study. 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 There is a chronic obstructing 1 cm calculus at the left  ureteropelvic junction, similar to previous study. The bladder appears decompressed and partly obscured by artifact from the right total hip arthroplasty. No bladder abnormality identified. Stomach/Bowel: No enteric contrast administered. There is peripheral displacement of bowel by the multiple enlarging intra-abdominal masses. The stomach appears unremarkable for its degree of distension. No evidence of bowel wall thickening, distention or surrounding inflammatory change. Vascular/Lymphatic: Bulky adenopathy again noted in the upper retroperitoneum with a retroaortic nodal mass measuring up to 4.9 x 3.5 cm on image 31/4. These nodal masses have mildly progressed. No acute vascular findings are seen. The portal, superior mesenteric and splenic veins appear patent. There is aortic and branch vessel atherosclerosis. Reproductive: Status post hysterectomy. Other: There are multiple enlarging complex intra-abdominal masses. A predominantly solid lesion in the left mid abdomen measures 17.0 x 14.7 cm on image 51/4 (previously 14.5 x 12.8 cm). A more complex cystic appearing lesion in the right mid abdomen measures 18.4 x 11.0 cm on image 34/4 (previously 17.8 x 11.5 cm). Multiple additional complex solid and cystic lesions have also mildly enlarged. There is mild ascites. There is progressive generalized anasarca with edema throughout the subcutaneous and deep fat. Musculoskeletal: No apparent osseous metastases or acute osseous findings. Status post right total hip arthroplasty. Multilevel lumbar spondylosis is grossly unchanged. Review of the MIP images confirms the above findings. IMPRESSION: 1. No evidence of acute pulmonary embolism. 2. Anasarca with generalized soft tissue edema, new pleural effusions and mildly increased abdominal ascites. 3. Further progression in multiple complex intra-abdominal and retroperitoneal masses consistent with progressive widely metastatic ovarian cancer as correlated with previous  radiology reports. No definite thoracic metastatic disease. 4. Increased atelectasis at both lung bases with patchy ground-glass opacities in both lungs which may reflect edema. 5. Chronic bilateral hydronephrosis with chronic obstructing proximal left ureteral calculus. 6. Cholelithiasis. Electronically Signed  By: Richardean Sale M.D.   On: 09/19/2021 12:34   VAS [place] LOWER EXTREMITY VENOUS (DVT) (ONLY [facility] & [facility])  Result Date: 09/19/2021  Lower Venous DVT Study Patient Name:  SHALONA HARBOUR  Date of Exam:   09/19/2021 Medical Rec #: [id number]        Accession #:    [id number] Date of Birth: 02/19/1946         Patient Gender: F Patient Age:   89 years Exam Location:  [facility] Procedure:      VAS [place] LOWER EXTREMITY VENOUS (DVT) Referring Phys: RILEY RANSOM --------------------------------------------------------------------------------  Indications: Swelling, and Edema.  Comparison Study: no prior Performing Technologist: Archie Patten RVS  Examination Guidelines: A complete evaluation includes B-mode imaging, spectral Doppler, color Doppler, and power Doppler as needed of all accessible portions of each vessel. Bilateral testing is considered an integral part of a complete examination. Limited examinations for reoccurring indications may be performed as noted. The reflux portion of the exam is performed with the patient in reverse Trendelenburg.  +-----+---------------+---------+-----------+----------+--------------+ RIGHTCompressibilityPhasicitySpontaneityPropertiesThrombus Aging +-----+---------------+---------+-----------+----------+--------------+ CFV  Full           Yes      Yes                                 +-----+---------------+---------+-----------+----------+--------------+   +---------+---------------+---------+-----------+----------+--------------+ LEFT     CompressibilityPhasicitySpontaneityPropertiesThrombus Aging  +---------+---------------+---------+-----------+----------+--------------+ CFV      Full           Yes      Yes                                 +---------+---------------+---------+-----------+----------+--------------+ SFJ      Full                                                        +---------+---------------+---------+-----------+----------+--------------+ FV Prox  Full                                                        +---------+---------------+---------+-----------+----------+--------------+ FV Mid   Full                                                        +---------+---------------+---------+-----------+----------+--------------+ FV DistalFull                                                        +---------+---------------+---------+-----------+----------+--------------+ PFV      Full                                                        +---------+---------------+---------+-----------+----------+--------------+  POP      Full           Yes      Yes                                 +---------+---------------+---------+-----------+----------+--------------+ PTV      Full                                                        +---------+---------------+---------+-----------+----------+--------------+     Summary: RIGHT: - No evidence of common femoral vein obstruction.  LEFT: - There is no evidence of deep vein thrombosis in the lower extremity.  - No cystic structure found in the popliteal fossa.  *See table(s) above for measurements and observations. Electronically signed by Deitra Mayo MD on 09/19/2021 at 1:30:45 PM.    Final         Scheduled Meds:  aspirin  325 mg Oral Daily   atorvastatin  80 mg Oral q1800   clopidogrel  75 mg Oral Daily   furosemide  40 mg Oral Daily   heparin injection (subcutaneous)  5,000 Units Subcutaneous Q12H   metoprolol tartrate  37.5 mg Oral BID   sacubitril-valsartan  1 tablet Oral BID    Continuous Infusions:  promethazine (PHENERGAN) injection (IM or IVPB) Stopped (09/20/21 0301)     LOS: 1 day    Time spent: 35 minutes    Barb Merino, MD [facility] Pager [phone]

## 2021-09-20 NOTE — ED Notes (Signed)
Patient complaining of nausea at this time.  Patient to be medicated per admission orders.

## 2021-09-21 ENCOUNTER — Inpatient Hospital Stay (HOSPITAL_COMMUNITY): Payer: Medicare Other

## 2021-09-21 DIAGNOSIS — G893 Neoplasm related pain (acute) (chronic): Secondary | ICD-10-CM | POA: Diagnosis not present

## 2021-09-21 DIAGNOSIS — I214 Non-ST elevation (NSTEMI) myocardial infarction: Secondary | ICD-10-CM

## 2021-09-21 LAB — ECHOCARDIOGRAM COMPLETE
Area-P 1/2: 6.09 cm2
Calc EF: 54 %
P 1/2 time: 853 msec
S' Lateral: 2.6 cm
Single Plane A2C EF: 63.9 %
Single Plane A4C EF: 46.8 %
Weight: 2345.69 oz

## 2021-09-21 LAB — HEMOGLOBIN A1C
Hgb A1c MFr Bld: 5.6 % (ref 4.8–5.6)
Mean Plasma Glucose: 114.02 mg/dL

## 2021-09-21 NOTE — Progress Notes (Signed)
PROGRESS NOTE    Jennifer Watkins  LDJ:[id number] DOB: [birth year] DOA: 09/19/2021 PCP: Gayland Curry, DO    Brief Narrative:  75 year old with history of coronary artery disease, non-STEMI March 2022 with cardiac cath showing 100% occlusion of proximal circumflex on aspirin and Plavix, chronic systolic congestive heart failure on Entresto, metastatic ovarian cancer on chemotherapy on home palliative care, hypertension, hyperlipidemia and chronic bilateral obstructive uropathy/hydronephrosis presented with nausea and vomiting and abdominal pain. Has been on Vicodin at home and ran out.  In the emergency room, CTA negative for PE, chronic bilateral hydronephrosis, troponins elevated [phone].  EKG with no acute ST changes.  Cardiology consulted.  Initially started on heparin drip and admitted to the hospital.  CT scan of the abdomen pelvis consistent with widely metastatic ovarian cancer.   Assessment & Plan:   Principal Problem:   Pain of metastatic malignancy Active Problems:   ACS (acute coronary syndrome) (HCC)   NSTEMI (non-ST elevated myocardial infarction) (HCC)   Disseminated ovarian cancer, unspecified laterality (HCC)  Non-STEMI with history of coronary artery disease and circumflex occlusion: Currently chest pain-free.  Unlikely acute coronary syndrome. On aspirin Plavix and statin, on metoprolol. Delene Loll was resumed.  Blood pressures less than 95.  We will discontinue Entresto in the setting of low blood pressures.  Her symptomatology is likely due to metastatic ovarian cancer.  With her limited life expectancy, unlikely will benefit with any cardiac intervention.  Seen by cardiology.  Chronic systolic heart failure: Euvolemic.  Resume Lasix with parameters.  Metastatic ovarian cancer: Followed by oncology at [facility].  Widely metastatic disease and declined chemotherapy.  Pain control and palliation. Patient is followed by outpatient palliative care.  She is on  hydrocodone at home.  Her pain is likely due to worsening metastatic disease burden in the abdomen causing episodic intestinal compression.  Patient may be nearing end-of-life and she is aware. Patient still with significant symptoms. Also using IV Dilaudid without any allergies. Will consult palliative care in house to help manage her symptoms.  She may even qualify to go home with home hospice. Patient is agreeable to meet with inpatient palliative care team as she is followed by [facility] as outpatient.  Chronic normocytic anemia: Stable.  Hyponatremia: Stable.  DVT prophylaxis: heparin injection 5,000 Units Start: 09/19/21 2200   Code Status: DNR Family Communication: None at the bedside Disposition Plan: Status is: Inpatient  Remains inpatient appropriate because: Significant abdominal pain.  Metastatic cancer pain.   Consultants:  Cardiology Palliative care  Procedures:  None  Antimicrobials:  None   Subjective: Patient was seen and examined.  She was eating breakfast.  Complained of intermittent abdominal pain.  No chest pain or shortness of breath.  I offered her to start on long-acting pain medication and increased dose of hydrocodone that she takes, she does not want to go on long-acting MS Contin/OxyContin/fentanyl patches.  She tells me she is allergic to all those medications.  She has been tolerating hydrocodone.  She wants to discuss this with Dr. Mariea Clonts.  I told her to talk to inpatient palliative care team today and we will communicate with Dr. Mariea Clonts and find a good medication regimen for her.     Objective: Vitals:   09/21/21 0043 09/21/21 0100 09/21/21 0911 09/21/21 0921  BP: 98/67  98/68   Pulse: 82   88  Resp:      Temp: 98.3 F (36.8 C)     TempSrc:      SpO2: 95%  Weight:  66.5 kg      Intake/Output Summary (Last 24 hours) at 09/21/2021 1028 Last data filed at 09/21/2021 0317 Gross per 24 hour  Intake 240 ml  Output 100 ml  Net 140 ml    Filed Weights   09/21/21 0100  Weight: 66.5 kg    Examination:  General: Looks comfortable and sitting in chair.  Eating breakfast. Cardiovascular: S1-S2 normal.  Regular rate rhythm. Respiratory: Bilateral clear.  No added sounds. Gastrointestinal: Soft.  Multiple palpable firm tumors mostly on the left side of the abdomen.  Nontender. Ext: No swelling or edema.  No cyanosis. Neuro: Alert oriented x4.  No focal deficits.    Data Reviewed: I have personally reviewed following labs and imaging studies  CBC: Recent Labs  Lab 09/19/21 0818 09/20/21 0418  WBC 10.7* 17.3*  NEUTROABS 9.7*  --   HGB 9.9* 10.5*  HCT 31.9* 33.3*  MCV 86.2 85.2  PLT 262 540   Basic Metabolic Panel: Recent Labs  Lab 09/19/21 0818 09/20/21 0418  NA 135 129*  K 3.7 4.1  CL 99 98  CO2 23 23  GLUCOSE 189* 144*  BUN 15 16  CREATININE 0.81 0.81  CALCIUM 9.7 8.7*   GFR: Estimated Creatinine Clearance: 56.6 mL/min (by C-G formula based on SCr of 0.81 mg/dL). Liver Function Tests: Recent Labs  Lab 09/19/21 0818  AST 28  ALT 15  ALKPHOS 105  BILITOT 0.7  PROT 6.8  ALBUMIN 3.8   Recent Labs  Lab 09/19/21 0818  LIPASE 24   No results for input(s): AMMONIA in the last 168 hours. Coagulation Profile: Recent Labs  Lab 09/19/21 1345  INR 1.2   Cardiac Enzymes: Recent Labs  Lab 09/19/21 1136  CKTOTAL 90   BNP (last 3 results) Recent Labs    04/12/21 1111  PROBNP 1,725*   HbA1C: Recent Labs    09/20/21 0418  HGBA1C 5.6   CBG: No results for input(s): GLUCAP in the last 168 hours. Lipid Profile: Recent Labs    09/20/21 0945  CHOL 126  HDL 34*  LDLCALC 64  TRIG 141  CHOLHDL 3.7   Thyroid Function Tests: No results for input(s): TSH, T4TOTAL, FREET4, T3FREE, THYROIDAB in the last 72 hours. Anemia Panel: No results for input(s): VITAMINB12, FOLATE, FERRITIN, TIBC, IRON, RETICCTPCT in the last 72 hours. Sepsis Labs: No results for input(s): PROCALCITON,  LATICACIDVEN in the last 168 hours.  Recent Results (from the past 240 hour(s))  Resp Panel by RT-PCR (Flu A&B, Covid) Nasopharyngeal Swab     Status: None   Collection Time: 09/19/21  9:24 AM   Specimen: Nasopharyngeal Swab; Nasopharyngeal(NP) swabs in vial transport medium  Result Value Ref Range Status   SARS Coronavirus 2 by RT PCR NEGATIVE NEGATIVE Final    Comment: (NOTE) SARS-CoV-2 target nucleic acids are NOT DETECTED.  The SARS-CoV-2 RNA is generally detectable in upper respiratory specimens during the acute phase of infection. The lowest concentration of SARS-CoV-2 viral copies this assay can detect is 138 copies/mL. A negative result does not preclude SARS-Cov-2 infection and should not be used as the sole basis for treatment or other patient management decisions. A negative result may occur with  improper specimen collection/handling, submission of specimen other than nasopharyngeal swab, presence of viral mutation(s) within the areas targeted by this assay, and inadequate number of viral copies(<138 copies/mL). A negative result must be combined with clinical observations, patient history, and epidemiological information. The expected result is Negative.  Fact Sheet  for Patients:  EntrepreneurPulse.com.au  Fact Sheet for Healthcare Providers:  IncredibleEmployment.be  This test is no t yet approved or cleared by the [place] FDA and  has been authorized for detection and/or diagnosis of SARS-CoV-2 by FDA under an Emergency Use Authorization (EUA). This EUA will remain  in effect (meaning this test can be used) for the duration of the COVID-19 declaration under Section 564(b)(1) of the Act, 21 U.S.C.section 360bbb-3(b)(1), unless the authorization is terminated  or revoked sooner.       Influenza A by PCR NEGATIVE NEGATIVE Final   Influenza B by PCR NEGATIVE NEGATIVE Final    Comment: (NOTE) The Xpert Xpress  SARS-CoV-2/FLU/RSV plus assay is intended as an aid in the diagnosis of influenza from Nasopharyngeal swab specimens and should not be used as a sole basis for treatment. Nasal washings and aspirates are unacceptable for Xpert Xpress SARS-CoV-2/FLU/RSV testing.  Fact Sheet for Patients: EntrepreneurPulse.com.au  Fact Sheet for Healthcare Providers: IncredibleEmployment.be  This test is not yet approved or cleared by the [place] FDA and has been authorized for detection and/or diagnosis of SARS-CoV-2 by FDA under an Emergency Use Authorization (EUA). This EUA will remain in effect (meaning this test can be used) for the duration of the COVID-19 declaration under Section 564(b)(1) of the Act, 21 U.S.C. section 360bbb-3(b)(1), unless the authorization is terminated or revoked.  Performed at [facility], [place] [address], [place], [place] [address]          Radiology Studies: CT Angio Chest PE W/Cm &/Or Wo Cm  Result Date: 09/19/2021 CLINICAL DATA:  Chest pain or shortness of breath, pleurisy or effusion suspected. Acute nonlocalized worsening chronic abdominal pain. Clinical concern for pulmonary embolism. EXAM: CT ANGIOGRAPHY CHEST CT ABDOMEN AND PELVIS WITH CONTRAST TECHNIQUE: Multidetector CT imaging of the chest was performed using the standard protocol during bolus administration of intravenous contrast. Multiplanar CT image reconstructions and MIPs were obtained to evaluate the vascular anatomy. Multidetector CT imaging of the abdomen and pelvis was performed using the standard protocol during bolus administration of intravenous contrast. CONTRAST:  158mL OMNIPAQUE IOHEXOL 350 MG/ML SOLN COMPARISON:  CTs of the chest, abdomen and pelvis 04/19/2021. FINDINGS: CTA CHEST FINDINGS Cardiovascular: The pulmonary arteries are well opacified with contrast to the level of the subsegmental branches. There is no evidence of acute pulmonary  embolism. There is limited opacification of the systemic arteries which demonstrate no gross acute abnormality. There is mild atherosclerosis of the aorta, great vessels and coronary arteries. The heart size is normal. There is no pericardial effusion. Mediastinum/Nodes: Anasarca with generalized subcutaneous and mediastinal edema. There is ill-defined fluid tracking superiorly from the abdomen into the retrocrural space. No discrete enlarged mediastinal, hilar or axillary lymph nodes are seen. The thyroid gland, trachea and esophagus demonstrate no significant findings. Lungs/Pleura: New small bilateral pleural effusions. There are new streaky opacities at both lung bases which are most consistent with atelectasis. In addition, there are patchy ground-glass opacities elsewhere, greatest in the right upper lobe, which may reflect edema. No confluent airspace opacity, dominant mass or suspicious pulmonary nodularity identified. Musculoskeletal/Chest wall: No chest wall mass or suspicious osseous findings. Grossly stable degenerative changes in the spine and both shoulders. CT ABDOMEN AND PELVIS FINDINGS Hepatobiliary: The liver is normal in density without suspicious focal abnormality. There is extrinsic mass effect on the right hepatic lobe by the enlarging intra-masses described below. Small gallstones are noted. No evidence of gallbladder wall thickening or significant biliary dilatation. Pancreas:  Displaced by the intra-abdominal masses. No pancreatic mass or ductal dilatation demonstrated. Spleen: Normal in size without focal abnormality. Adrenals/Urinary Tract: As before, the adrenal glands are not well visualized. There is persistent bilateral hydronephrosis with delayed contrast excretion, similar to previous study. 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 There is a chronic obstructing 1 cm calculus at the left ureteropelvic junction, similar to previous  study. The bladder appears decompressed and partly obscured by artifact from the right total hip arthroplasty. No bladder abnormality identified. Stomach/Bowel: No enteric contrast administered. There is peripheral displacement of bowel by the multiple enlarging intra-abdominal masses. The stomach appears unremarkable for its degree of distension. No evidence of bowel wall thickening, distention or surrounding inflammatory change. Vascular/Lymphatic: Bulky adenopathy again noted in the upper retroperitoneum with a retroaortic nodal mass measuring up to 4.9 x 3.5 cm on image 31/4. These nodal masses have mildly progressed. No acute vascular findings are seen. The portal, superior mesenteric and splenic veins appear patent. There is aortic and branch vessel atherosclerosis. Reproductive: Status post hysterectomy. Other: There are multiple enlarging complex intra-abdominal masses. A predominantly solid lesion in the left mid abdomen measures 17.0 x 14.7 cm on image 51/4 (previously 14.5 x 12.8 cm). A more complex cystic appearing lesion in the right mid abdomen measures 18.4 x 11.0 cm on image 34/4 (previously 17.8 x 11.5 cm). Multiple additional complex solid and cystic lesions have also mildly enlarged. There is mild ascites. There is progressive generalized anasarca with edema throughout the subcutaneous and deep fat. Musculoskeletal: No apparent osseous metastases or acute osseous findings. Status post right total hip arthroplasty. Multilevel lumbar spondylosis is grossly unchanged. Review of the MIP images confirms the above findings. IMPRESSION: 1. No evidence of acute pulmonary embolism. 2. Anasarca with generalized soft tissue edema, new pleural effusions and mildly increased abdominal ascites. 3. Further progression in multiple complex intra-abdominal and retroperitoneal masses consistent with progressive widely metastatic ovarian cancer as correlated with previous radiology reports. No definite thoracic  metastatic disease. 4. Increased atelectasis at both lung bases with patchy ground-glass opacities in both lungs which may reflect edema. 5. Chronic bilateral hydronephrosis with chronic obstructing proximal left ureteral calculus. 6. Cholelithiasis. Electronically Signed   By: Richardean Sale M.D.   On: 09/19/2021 12:34   CT Abdomen Pelvis W Contrast  Result Date: 09/19/2021 CLINICAL DATA:  Chest pain or shortness of breath, pleurisy or effusion suspected. Acute nonlocalized worsening chronic abdominal pain. Clinical concern for pulmonary embolism. EXAM: CT ANGIOGRAPHY CHEST CT ABDOMEN AND PELVIS WITH CONTRAST TECHNIQUE: Multidetector CT imaging of the chest was performed using the standard protocol during bolus administration of intravenous contrast. Multiplanar CT image reconstructions and MIPs were obtained to evaluate the vascular anatomy. Multidetector CT imaging of the abdomen and pelvis was performed using the standard protocol during bolus administration of intravenous contrast. CONTRAST:  167mL OMNIPAQUE IOHEXOL 350 MG/ML SOLN COMPARISON:  CTs of the chest, abdomen and pelvis 04/19/2021. FINDINGS: CTA CHEST FINDINGS Cardiovascular: The pulmonary arteries are well opacified with contrast to the level of the subsegmental branches. There is no evidence of acute pulmonary embolism. There is limited opacification of the systemic arteries which demonstrate no gross acute abnormality. There is mild atherosclerosis of the aorta, great vessels and coronary arteries. The heart size is normal. There is no pericardial effusion. Mediastinum/Nodes: Anasarca with generalized subcutaneous and mediastinal edema. There is ill-defined fluid tracking superiorly from the abdomen into the retrocrural space. No discrete enlarged mediastinal, hilar  or axillary lymph nodes are seen. The thyroid gland, trachea and esophagus demonstrate no significant findings. Lungs/Pleura: New small bilateral pleural effusions. There are new  streaky opacities at both lung bases which are most consistent with atelectasis. In addition, there are patchy ground-glass opacities elsewhere, greatest in the right upper lobe, which may reflect edema. No confluent airspace opacity, dominant mass or suspicious pulmonary nodularity identified. Musculoskeletal/Chest wall: No chest wall mass or suspicious osseous findings. Grossly stable degenerative changes in the spine and both shoulders. CT ABDOMEN AND PELVIS FINDINGS Hepatobiliary: The liver is normal in density without suspicious focal abnormality. There is extrinsic mass effect on the right hepatic lobe by the enlarging intra-masses described below. Small gallstones are noted. No evidence of gallbladder wall thickening or significant biliary dilatation. Pancreas: Displaced by the intra-abdominal masses. No pancreatic mass or ductal dilatation demonstrated. Spleen: Normal in size without focal abnormality. Adrenals/Urinary Tract: As before, the adrenal glands are not well visualized. There is persistent bilateral hydronephrosis with delayed contrast excretion, similar to previous study. 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 There is a chronic obstructing 1 cm calculus at the left ureteropelvic junction, similar to previous study. The bladder appears decompressed and partly obscured by artifact from the right total hip arthroplasty. No bladder abnormality identified. Stomach/Bowel: No enteric contrast administered. There is peripheral displacement of bowel by the multiple enlarging intra-abdominal masses. The stomach appears unremarkable for its degree of distension. No evidence of bowel wall thickening, distention or surrounding inflammatory change. Vascular/Lymphatic: Bulky adenopathy again noted in the upper retroperitoneum with a retroaortic nodal mass measuring up to 4.9 x 3.5 cm on image 31/4. These nodal masses have mildly progressed. No acute  vascular findings are seen. The portal, superior mesenteric and splenic veins appear patent. There is aortic and branch vessel atherosclerosis. Reproductive: Status post hysterectomy. Other: There are multiple enlarging complex intra-abdominal masses. A predominantly solid lesion in the left mid abdomen measures 17.0 x 14.7 cm on image 51/4 (previously 14.5 x 12.8 cm). A more complex cystic appearing lesion in the right mid abdomen measures 18.4 x 11.0 cm on image 34/4 (previously 17.8 x 11.5 cm). Multiple additional complex solid and cystic lesions have also mildly enlarged. There is mild ascites. There is progressive generalized anasarca with edema throughout the subcutaneous and deep fat. Musculoskeletal: No apparent osseous metastases or acute osseous findings. Status post right total hip arthroplasty. Multilevel lumbar spondylosis is grossly unchanged. Review of the MIP images confirms the above findings. IMPRESSION: 1. No evidence of acute pulmonary embolism. 2. Anasarca with generalized soft tissue edema, new pleural effusions and mildly increased abdominal ascites. 3. Further progression in multiple complex intra-abdominal and retroperitoneal masses consistent with progressive widely metastatic ovarian cancer as correlated with previous radiology reports. No definite thoracic metastatic disease. 4. Increased atelectasis at both lung bases with patchy ground-glass opacities in both lungs which may reflect edema. 5. Chronic bilateral hydronephrosis with chronic obstructing proximal left ureteral calculus. 6. Cholelithiasis. Electronically Signed   By: Richardean Sale M.D.   On: 09/19/2021 12:34        Scheduled Meds:  aspirin  325 mg Oral Daily   atorvastatin  80 mg Oral q1800   clopidogrel  75 mg Oral Daily   furosemide  40 mg Oral Daily   heparin injection (subcutaneous)  5,000 Units Subcutaneous Q12H   metoprolol tartrate  37.5 mg Oral BID   Continuous Infusions:  promethazine (PHENERGAN)  injection (IM or IVPB) Stopped (  09/20/21 0301)     LOS: 2 days    Time spent: 30 minutes   Barb Merino, MD [facility] Pager [phone]

## 2021-09-22 ENCOUNTER — Telehealth: Payer: Self-pay

## 2021-09-22 DIAGNOSIS — C799 Secondary malignant neoplasm of unspecified site: Secondary | ICD-10-CM

## 2021-09-22 DIAGNOSIS — G893 Neoplasm related pain (acute) (chronic): Secondary | ICD-10-CM | POA: Diagnosis not present

## 2021-09-22 DIAGNOSIS — Z515 Encounter for palliative care: Secondary | ICD-10-CM | POA: Diagnosis not present

## 2021-09-22 MED ORDER — HYDROMORPHONE HCL 2 MG PO TABS
2.0000 mg | ORAL_TABLET | Freq: Four times a day (QID) | ORAL | Status: DC
  Administered 2021-09-22 – 2021-09-23 (×4): 2 mg via ORAL
  Filled 2021-09-22 (×4): qty 1

## 2021-09-22 MED ORDER — SENNOSIDES-DOCUSATE SODIUM 8.6-50 MG PO TABS
1.0000 | ORAL_TABLET | Freq: Every day | ORAL | Status: DC
  Administered 2021-09-22 – 2021-09-23 (×2): 1 via ORAL
  Filled 2021-09-22 (×2): qty 1

## 2021-09-22 NOTE — TOC Progression Note (Addendum)
Transition of Care [facility]) - Progression Note    Patient Details  Name: [name] MRN: [id number] Date of Birth: Jan 03, 1946  [facility] CM/SW Contact  Zenon Mayo, RN Phone Number: 09/22/2021, 2:52 PM  Clinical Narrative:    NCM received consult for home hospice with [facility], patient is currently with [facility] for outpatient palliative services and now needs home hospice.  NCM made referral to [facility] with [facility].  Patient states she does not want a hospital bed.  She also states she was going to have one of her friends to transport her home tomorrow.  Latanya Presser will call patient to see what other DME needs she may need.        Expected Discharge Plan and Services                                                 Social Determinants of Health (SDOH) Interventions    Readmission Risk Interventions No flowsheet data found.

## 2021-09-22 NOTE — Progress Notes (Signed)
PROGRESS NOTE    Mischele Detter  FXT:[id number] DOB: 02-25-1946 DOA: 09/19/2021 PCP: Gayland Curry, DO    Brief Narrative:  75 year old with history of coronary artery disease, non-STEMI March 2022 with cardiac cath showing 100% occlusion of proximal circumflex on aspirin and Plavix, chronic systolic congestive heart failure on Entresto, metastatic ovarian cancer on chemotherapy on home palliative care, hypertension, hyperlipidemia and chronic bilateral obstructive uropathy/hydronephrosis presented with nausea and vomiting and abdominal pain. Has been on Vicodin at home and ran out.  In the emergency room, CTA negative for PE, chronic bilateral hydronephrosis, troponins elevated [phone].  EKG with no acute ST changes.  Cardiology consulted.  Initially started on heparin drip and admitted to the hospital.  CT scan of the abdomen pelvis consistent with widely metastatic ovarian cancer.   Assessment & Plan:   Principal Problem:   Pain of metastatic malignancy Active Problems:   ACS (acute coronary syndrome) (HCC)   NSTEMI (non-ST elevated myocardial infarction) (HCC)   Disseminated ovarian cancer, unspecified laterality (HCC)  Non-STEMI with history of coronary artery disease and circumflex occlusion: Currently chest pain-free.  Unlikely acute coronary syndrome. On aspirin Plavix and statin, on metoprolol. will discontinue Entresto in the setting of low blood pressures.  Her symptomatology is likely due to metastatic ovarian cancer.  With her limited life expectancy, unlikely will benefit with any cardiac intervention.  Seen by cardiology.  Chronic systolic heart failure: Euvolemic.  Resumed Lasix , she is euvolemic.  Metastatic ovarian cancer: Followed by oncology at [facility].  Widely metastatic disease and declined chemotherapy.  Pain control and palliation. Patient is followed by outpatient palliative care.  She is on hydrocodone at home.  Her pain is likely due to worsening  metastatic disease burden in the abdomen causing episodic intestinal compression.  Patient may be nearing end-of-life and she is aware. She will try oral Dilaudid today and stop using IV Dilaudid.  If good pain control by tomorrow, will discharge home with oral Dilaudid. Seen by palliative care. Patient agreed to change her care from palliation to hospice at home.   DVT prophylaxis:   Heparin subcu.  Patient declined.   Code Status: DNR Family Communication: None at the bedside Disposition Plan: Status is: Inpatient  Remains inpatient appropriate because: Significant abdominal pain.  Metastatic cancer pain.   Consultants:  Cardiology Palliative care  Procedures:  None  Antimicrobials:  None   Subjective: Patient seen and examined.  She had a good bowel movement.  Denies any abdominal pain today, she did use dose of oral Dilaudid earlier and is expecting not to have recurrent pain.  Denies any nausea vomiting.  She met with palliative provider and was very pleased.  She understands she is at end-of-life.  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 She tells me she is waiting for her cardiologist to see her.   Objective: Vitals:   09/21/21 1142 09/21/21 1938 09/22/21 0400 09/22/21 0944  BP: 101/73 94/62 119/73 95/63  Pulse: 74 87 92 98  Resp: 16 16  16   Temp: 98 F (36.7 C) 97.6 F (36.4 C) 98.3 F (36.8 C) 98 F (36.7 C)  TempSrc:  Oral Oral Oral  SpO2: 100% 96% 96% 95%  Weight:   67.4 kg     Intake/Output Summary (Last 24 hours) at 09/22/2021 1521 Last data filed at 09/22/2021 0900 Gross per 24 hour  Intake 360 ml  Output --  Net 360 ml  Filed Weights   09/21/21 0100 09/22/21 0400  Weight: 66.5 kg 67.4 kg    Examination:  General: Looks comfortable and walking around in the room. Cardiovascular: S1-S2 normal.  Regular rate rhythm. Respiratory: Bilateral clear.  No added sounds. Gastrointestinal: Soft.  Multiple  palpable firm tumors mostly on the left side of the abdomen.  Nontender. Ext: No swelling or edema.  No cyanosis. Neuro: Alert oriented x4.  No focal deficits.    Data Reviewed: I have personally reviewed following labs and imaging studies  CBC: Recent Labs  Lab 09/19/21 0818 09/20/21 0418  WBC 10.7* 17.3*  NEUTROABS 9.7*  --   HGB 9.9* 10.5*  HCT 31.9* 33.3*  MCV 86.2 85.2  PLT 262 650   Basic Metabolic Panel: Recent Labs  Lab 09/19/21 0818 09/20/21 0418  NA 135 129*  K 3.7 4.1  CL 99 98  CO2 23 23  GLUCOSE 189* 144*  BUN 15 16  CREATININE 0.81 0.81  CALCIUM 9.7 8.7*   GFR: Estimated Creatinine Clearance: 57 mL/min (by C-G formula based on SCr of 0.81 mg/dL). Liver Function Tests: Recent Labs  Lab 09/19/21 0818  AST 28  ALT 15  ALKPHOS 105  BILITOT 0.7  PROT 6.8  ALBUMIN 3.8   Recent Labs  Lab 09/19/21 0818  LIPASE 24   No results for input(s): AMMONIA in the last 168 hours. Coagulation Profile: Recent Labs  Lab 09/19/21 1345  INR 1.2   Cardiac Enzymes: Recent Labs  Lab 09/19/21 1136  CKTOTAL 90   BNP (last 3 results) Recent Labs    04/12/21 1111  PROBNP 1,725*   HbA1C: Recent Labs    09/20/21 0418  HGBA1C 5.6   CBG: No results for input(s): GLUCAP in the last 168 hours. Lipid Profile: Recent Labs    09/20/21 0945  CHOL 126  HDL 34*  LDLCALC 64  TRIG 141  CHOLHDL 3.7   Thyroid Function Tests: No results for input(s): TSH, T4TOTAL, FREET4, T3FREE, THYROIDAB in the last 72 hours. Anemia Panel: No results for input(s): VITAMINB12, FOLATE, FERRITIN, TIBC, IRON, RETICCTPCT in the last 72 hours. Sepsis Labs: No results for input(s): PROCALCITON, LATICACIDVEN in the last 168 hours.  Recent Results (from the past 240 hour(s))  Resp Panel by RT-PCR (Flu A&B, Covid) Nasopharyngeal Swab     Status: None   Collection Time: 09/19/21  9:24 AM   Specimen: Nasopharyngeal Swab; Nasopharyngeal(NP) swabs in vial transport medium   Result Value Ref Range Status   SARS Coronavirus 2 by RT PCR NEGATIVE NEGATIVE Final    Comment: (NOTE) SARS-CoV-2 target nucleic acids are NOT DETECTED.  The SARS-CoV-2 RNA is generally detectable in upper respiratory specimens during the acute phase of infection. The lowest concentration of SARS-CoV-2 viral copies this assay can detect is 138 copies/mL. A negative result does not preclude SARS-Cov-2 infection and should not be used as the sole basis for treatment or other patient management decisions. A negative result may occur with  improper specimen collection/handling, submission of specimen other than nasopharyngeal swab, presence of viral mutation(s) within the areas targeted by this assay, and inadequate number of viral copies(<138 copies/mL). A negative result must be combined with clinical observations, patient history, and epidemiological information. The expected result is Negative.  Fact Sheet for Patients:  EntrepreneurPulse.com.au  Fact Sheet for Healthcare Providers:  IncredibleEmployment.be  This test is no t yet approved or cleared by the [place] FDA and  has been authorized for detection and/or diagnosis of SARS-CoV-2  by FDA under an Emergency Use Authorization (EUA). This EUA will remain  in effect (meaning this test can be used) for the duration of the COVID-19 declaration under Section 564(b)(1) of the Act, 21 U.S.C.section 360bbb-3(b)(1), unless the authorization is terminated  or revoked sooner.       Influenza A by PCR NEGATIVE NEGATIVE Final   Influenza B by PCR NEGATIVE NEGATIVE Final    Comment: (NOTE) The Xpert Xpress SARS-CoV-2/FLU/RSV plus assay is intended as an aid in the diagnosis of influenza from Nasopharyngeal swab specimens and should not be used as a sole basis for treatment. Nasal washings and aspirates are unacceptable for Xpert Xpress SARS-CoV-2/FLU/RSV testing.  Fact Sheet for  Patients: EntrepreneurPulse.com.au  Fact Sheet for Healthcare Providers: IncredibleEmployment.be  This test is not yet approved or cleared by the [place] FDA and has been authorized for detection and/or diagnosis of SARS-CoV-2 by FDA under an Emergency Use Authorization (EUA). This EUA will remain in effect (meaning this test can be used) for the duration of the COVID-19 declaration under Section 564(b)(1) of the Act, 21 U.S.C. section 360bbb-3(b)(1), unless the authorization is terminated or revoked.  Performed at [facility], [place] [address] [place], [place] [address]          Radiology Studies: ECHOCARDIOGRAM COMPLETE  Result Date: 09/21/2021    ECHOCARDIOGRAM REPORT   Patient Name:   Jorje Guild Date of Exam: 09/21/2021 Medical Rec #:  [id number]       Height:       63.0 in Accession #:    [id number]      Weight:       146.6 lb Date of Birth:  Sep 17, 1946        BSA:          1.695 m Patient Age:    36 years        BP:           98/67 mmHg Patient Gender: F               HR:           88 bpm. Exam Location:  Inpatient Procedure: 2D Echo, Cardiac Doppler and Color Doppler Indications:    NTEMI  History:        Patient has prior history of Echocardiogram examinations, most                 recent 01/28/2021.  Sonographer:    Helmut Muster Referring Phys: [id number] [place] Comments: Pt declined constrast. IMPRESSIONS  1. Left ventricular ejection fraction, by estimation, is 35 to 40%. The left ventricle has moderately decreased function. The left ventricle demonstrates regional wall motion abnormalities (see scoring diagram/findings for description). Left ventricular  diastolic parameters are consistent with Grade II diastolic dysfunction (pseudonormalization).  2. Right ventricular systolic function is normal. The right ventricular size is normal.  3. Left atrial size was mild to moderately dilated.  4. The mitral valve is  grossly normal. Mild mitral valve regurgitation. No evidence of mitral stenosis.  5. The aortic valve is tricuspid. Aortic valve regurgitation is mild. No aortic stenosis is present. Comparison(s): No significant change from prior study. Conclusion(s)/Recommendation(s): Findings consistent with ischemic cardiomyopathy. FINDINGS  Left Ventricle: Left ventricular ejection fraction, by estimation, is 35 to 40%. The left ventricle has moderately decreased function. The left ventricle demonstrates regional wall motion abnormalities. The left ventricular internal cavity size was normal in size. There is no left ventricular hypertrophy.  Left ventricular diastolic parameters are consistent with Grade II diastolic dysfunction (pseudonormalization).  LV Wall Scoring: The entire lateral wall is hypokinetic. Right Ventricle: The right ventricular size is normal. No increase in right ventricular wall thickness. Right ventricular systolic function is normal. Left Atrium: Left atrial size was mild to moderately dilated. Right Atrium: Right atrial size was normal in size. Prominent Eustachian valve. Pericardium: There is no evidence of pericardial effusion. Mitral Valve: The mitral valve is grossly normal. Mild mitral valve regurgitation. No evidence of mitral valve stenosis. Tricuspid Valve: The tricuspid valve is grossly normal. Tricuspid valve regurgitation is mild . No evidence of tricuspid stenosis. Aortic Valve: The aortic valve is tricuspid. Aortic valve regurgitation is mild. Aortic regurgitation PHT measures 853 msec. No aortic stenosis is present. Pulmonic Valve: The pulmonic valve was grossly normal. Pulmonic valve regurgitation is trivial. No evidence of pulmonic stenosis. Aorta: The aortic root and ascending aorta are structurally normal, with no evidence of dilitation. Venous: The right lower pulmonary vein is normal. The inferior vena cava was not well visualized. IAS/Shunts: The atrial septum is grossly normal.   LEFT VENTRICLE PLAX 2D LVIDd:         3.70 cm     Diastology LVIDs:         2.60 cm     LV e' medial:    5.77 cm/s LV PW:         1.00 cm     LV E/e' medial:  18.7 LV IVS:        1.00 cm     LV e' lateral:   10.60 cm/s LVOT diam:     1.90 cm     LV E/e' lateral: 10.2 LV SV:         60 LV SV Index:   35 LVOT Area:     2.84 cm  LV Volumes (MOD) LV vol d, MOD A2C: 63.8 ml LV vol d, MOD A4C: 80.2 ml LV vol s, MOD A2C: 23.0 ml LV vol s, MOD A4C: 42.7 ml LV SV MOD A2C:     40.8 ml LV SV MOD A4C:     80.2 ml LV SV MOD BP:      38.7 ml RIGHT VENTRICLE RV S prime:     11.30 cm/s TAPSE (M-mode): 2.0 cm LEFT ATRIUM             Index        RIGHT ATRIUM           Index LA diam:        4.00 cm 2.36 cm/m   RA Area:     10.90 cm LA Vol (A2C):   80.1 ml 47.27 ml/m  RA Volume:   20.70 ml  12.22 ml/m LA Vol (A4C):   66.2 ml 39.07 ml/m LA Biplane Vol: 77.2 ml 45.56 ml/m  AORTIC VALVE LVOT Vmax:   115.00 cm/s LVOT Vmean:  75.500 cm/s LVOT VTI:    0.212 m AI PHT:      853 msec  AORTA Ao Root diam: 3.40 cm Ao Asc diam:  3.90 cm MITRAL VALVE                TRICUSPID VALVE MV Area (PHT): 6.09 cm     TR Peak grad:   47.6 mmHg MV Decel Time: 125 msec     TR Vmax:        345.00 cm/s MV E velocity: 108.00 cm/s MV A velocity: 125.00 cm/s  SHUNTS MV  E/A ratio:  0.86         Systemic VTI:  0.21 m                             Systemic Diam: 1.90 cm Eleonore Chiquito MD Electronically signed by Eleonore Chiquito MD Signature Date/Time: 09/21/2021/4:09:58 PM    Final         Scheduled Meds:  aspirin  325 mg Oral Daily   atorvastatin  80 mg Oral q1800   clopidogrel  75 mg Oral Daily   furosemide  40 mg Oral Daily   HYDROmorphone  2 mg Oral Q6H   metoprolol tartrate  37.5 mg Oral BID   senna-docusate  1 tablet Oral QHS   Continuous Infusions:  promethazine (PHENERGAN) injection (IM or IVPB) Stopped (09/20/21 0301)     LOS: 3 days    Time spent: 30 minutes   Barb Merino, MD [facility] Pager [phone]

## 2021-09-22 NOTE — Telephone Encounter (Signed)
Called pt no answer, left a vm

## 2021-09-22 NOTE — Progress Notes (Signed)
Patient is requesting it be passed on that she would rather be discharged on Friday, not tomorrow (Thursday) d/t pain management and transportation issues. Edwena Blow, RN

## 2021-09-22 NOTE — Telephone Encounter (Signed)
I have been following her chart. I agree with the ongoing care. Please reassure the patient. I will see her later today.  Thanks MJP

## 2021-09-22 NOTE — Care Management Important Message (Signed)
Important Message  Patient Details  Name: Elda Dunkerson MRN: [id number] Date of Birth: 1946/06/12   Medicare Important Message Given:  Yes     Joanathan Affeldt 09/22/2021, 2:00 PM

## 2021-09-22 NOTE — Progress Notes (Signed)
I made a courtesy visit to see the patient today. Patient known to me since her new and concurrent diagnosis of ACS and metastatic granulosa cell tumor. I agree with the conservative management from cardia standpoint and shift to hospice care given progression of her metastatic disease. I agree with no heparin. We could even discontinue plavix or aspirin and plavix both. I wished her pain free hospital as well as hospice stay.   Nigel Mormon, MD Pager: [phone] Office: [phone]

## 2021-09-22 NOTE — Consult Note (Signed)
Consultation Note Date: 09/22/2021   Patient Name: Jennifer Watkins  DOB: [birth year]  MRN: [id number]  Age / Sex: 75 y.o., female  PCP: Gayland Curry, DO Referring Physician: Barb Merino, MD  Reason for Consultation: pain management, possible step up care to hospice   HPI/Patient Profile: 75 y.o. female  with past medical history of CAD, NSTEMI in March of this year, chronic systolic heart failure, metastatic ovarian cancer (s/p surgical resection with recurrence diagnosed in March of this year- declined adjuvant chemotherapy and hormone therapy with decision to focus on symptom control - is utilizing subcutaneous mistletoe injections) admitted on 09/19/2021 with severe abdominal pain, nausea and vomiting. CT scan of her abdomen shows progression of her cancer with increasing size and number of tumors. Palliative medicine consulted for pain management and possible referral to hospice care.    Primary Decision Maker PATIENT  Discussion: Met at bedside with [facility]. She lives at home and has been independent. She walks daily. Has continued to work at [employer] part time.  She values her care team- specifically Dr. Mariea Clonts from [facility] and Dr. Virgina Jock, Cardiology.  Her goals of care are to focus on her comfort and quality of life as far as her cancer is concerned and to follow any recommendations from cardiology regarding her heart failure. She wishes to continue to live as long as possible while also maintaining her quality of life and focusing on symptom management.  She has a DNR and advanced directives and HCPOA. She is aware that her cancer is incurable.   As far as pain management goes- she really has not been requiring a lot of pain medication. She had an intense episode of pain in July and received norco from the emergency room and oxycodone from Dr. Clarene Essex.  She had not used the oxycodone  until this most recent episode of uncontrollable pain and that resulted in nausea and vomiting which is why she came to the hospital.  Hydromorphone .70m IV q3 hours has provided her with relief. She notes that fentanyl an oxycodone make her sick.  Based on her 24 hour use and oral morphine equivalent- recommend starting hydromorphone scheduled 271mq6hrs. Will continue her hydromorphone IV prn for breakthrough. She is at high risk of bowel obstruction due to abdominal tumors. She eats a "strong" mediterranean diet, drinks smooth move tea, and tries to exercise regularly. She had a bowel movement Saturday, and another yesterday after drinking warm prune juice. Given the progression of her cancer requiring hospitalization and need for increased symptom management- we discussed Hospice services. She is in agreement to referral to [facility] to consider transitioning from Palliative to hospice.     SUMMARY OF RECOMMENDATIONS -Continue current plan of care -Hydromorphone 35m70mo q6hr scheduled -Hydromorphone .5mg85m q3hr prn for breakthrough pain -Senna 1 po QHS for bowel prophylaxis -Advanced Directives and HCPOA [place] to [place]osp[place] - she is currently seen by [facility] Palliative  Code Status/Advance Care Planning: DNR  Prognosis:   < 6 months due to worsening metastatic ovarian cancer  Discharge Planning: Home with Hospice   Review of Systems  Constitutional:  Positive for activity change and appetite change.  Gastrointestinal:  Positive for abdominal distention, abdominal pain and constipation.   Physical Exam Vitals and nursing note reviewed.  Cardiovascular:     Rate and Rhythm: Normal rate.  Pulmonary:     Effort: Pulmonary effort is normal.  Abdominal:     General: There is distension.     Palpations: There is mass.  Neurological:     General: No focal deficit present.     Mental Status: She is alert and oriented to  person, place, and time.    Vital Signs: BP 95/63 (BP Location: Left Arm)   Pulse 98   Temp 98 F (36.7 C) (Oral)   Resp 16   Wt 67.4 kg   SpO2 95%   BMI 26.31 kg/m  Pain Scale: 0-10 POSS *See Group Information*: 1-Acceptable,Awake and alert Pain Score: 0-No pain   SpO2: SpO2: 95 % O2 Device:SpO2: 95 % O2 Flow Rate: .O2 Flow Rate (L/min): 2 L/min  IO: Intake/output summary:  Intake/Output Summary (Last 24 hours) at 09/22/2021 1101 Last data filed at 09/22/2021 0900 Gross per 24 hour  Intake 360 ml  Output --  Net 360 ml    LBM: Last BM Date: 09/22/21 Baseline Weight: Weight: 66.5 kg Most recent weight: Weight: 67.4 kg     Palliative Assessment/Data:     Thank you for this consult. Palliative medicine will continue to follow and assist as needed.   Time In: 1015 Time Out: 1135 Time Total: 80 minutes Greater than 50%  of this time was spent counseling and coordinating care related to the above assessment and plan.  Signed by: Mariana Kaufman, AGNP-C Palliative Medicine    Please contact Palliative Medicine Team phone at [phone] for questions and concerns.  For individual provider: See Shea Evans

## 2021-09-23 DIAGNOSIS — G893 Neoplasm related pain (acute) (chronic): Secondary | ICD-10-CM | POA: Diagnosis not present

## 2021-09-23 DIAGNOSIS — C799 Secondary malignant neoplasm of unspecified site: Secondary | ICD-10-CM | POA: Diagnosis not present

## 2021-09-23 DIAGNOSIS — Z515 Encounter for palliative care: Secondary | ICD-10-CM | POA: Diagnosis not present

## 2021-09-23 MED ORDER — HYDROMORPHONE HCL 2 MG PO TABS
2.0000 mg | ORAL_TABLET | ORAL | Status: DC
  Administered 2021-09-23 – 2021-09-24 (×7): 2 mg via ORAL
  Filled 2021-09-23 (×7): qty 1

## 2021-09-23 MED ORDER — HYDROMORPHONE HCL 2 MG PO TABS
2.0000 mg | ORAL_TABLET | ORAL | Status: DC | PRN
  Administered 2021-09-23: 2 mg via ORAL
  Filled 2021-09-23: qty 1

## 2021-09-23 NOTE — Progress Notes (Signed)
Daily Progress Note   Patient Name: Jennifer Watkins       Date: 09/23/2021 DOB: June 25, 1946  Age: 75 y.o. MRN#: [id number] Attending Physician: Barb Merino, MD Primary Care Physician: Gayland Curry, DO Admit Date: 09/19/2021  Reason for Consultation/Follow-up: Establishing goals of care  Patient Profile/HPI:  75 y.o. female  with past medical history of CAD, NSTEMI in March of this year, chronic systolic heart failure, metastatic ovarian cancer (s/p surgical resection with recurrence diagnosed in March of this year- declined adjuvant chemotherapy and hormone therapy with decision to focus on symptom control - is utilizing subcutaneous mistletoe injections) admitted on 09/19/2021 with severe abdominal pain, nausea and vomiting. CT scan of her abdomen shows progression of her cancer with increasing size and number of tumors. Palliative medicine consulted for pain management and possible referral to hospice care.    Subjective: Jennifer Watkins is feeling better today. She feels her pain is well controlled. She reported to pharmacist resident Jennifer Watkins that her pain is controlled with dosing of po medication, however, it returns before next dose is due.  Based on her 24 hour total morphine oral equivalent requirements - we will increase her po dosing to 2mg  q4 hours. Will also add 2mg  q2hr prn po dose for breakthrough pain. Will continue IV dose for severe pain only.  She is eager to return  home to feed her bluebirds and to nourish her spirit by being outdoors.   Review of Systems  Gastrointestinal:  Negative for nausea.    Physical Exam Vitals and nursing note reviewed.  Cardiovascular:     Rate and Rhythm: Normal rate.  Pulmonary:     Effort: Pulmonary effort is normal.  Abdominal:     General:  There is distension.     Palpations: There is mass.  Neurological:     Mental Status: She is alert and oriented to person, place, and time.  Psychiatric:        Mood and Affect: Mood normal.        Thought Content: Thought content normal.        Judgment: Judgment normal.            Vital Signs: BP 117/68 (BP Location: Left Arm)   Pulse 95   Temp 98.6 F (37 C) (Oral)   Resp 18  Wt 66.9 kg Comment: scale c  SpO2 95%   BMI 26.11 kg/m  SpO2: SpO2: 95 % O2 Device: O2 Device: Room Air O2 Flow Rate: O2 Flow Rate (L/min): 2 L/min  Intake/output summary:  Intake/Output Summary (Last 24 hours) at 09/23/2021 1113 Last data filed at 09/23/2021 0841 Gross per 24 hour  Intake 236 ml  Output --  Net 236 ml   LBM: Last BM Date: 09/22/21 Baseline Weight: Weight: 66.5 kg Most recent weight: Weight: 66.9 kg (scale c)       Palliative Assessment/Data: PPS: 70%      Patient Active Problem List   Diagnosis Date Noted  . Pain of metastatic malignancy 09/20/2021  . Preop cardiovascular exam 02/17/2021  . Coronary artery disease involving coronary bypass graft of native heart without angina pectoris 02/02/2021  . HFrEF (heart failure with reduced ejection fraction) ([place]) 02/02/2021  . Disseminated ovarian cancer, unspecified laterality ([place]) 02/01/2021  . ACS (acute coronary syndrome) ([place]) 01/28/2021  . NSTEMI (non-ST elevated myocardial infarction) [facility])     Palliative Care Assessment & Plan    Assessment/Recommendations/Plan  Change hydromorphone po to 2mg  q4hr and add 2mg  q2hr prn  Encouraged patient to utilize po medication before IV medication in efforts to get her off IV so that she can D/C home TOC referral made for home with Hospice services- she is hopeful to be able to discharge home on Friday   Code Status: DNR  Prognosis:  < 6 months  Discharge Planning: Home with Hospice  Care plan was discussed with patient and care team  Thank you for allowing the  Palliative Medicine Team to assist in the care of this patient.  Total time:  55 mins      Greater than 50%  of this time was spent counseling and coordinating care related to the above assessment and plan.  Mariana Kaufman, AGNP-C Palliative Medicine   Please contact Palliative Medicine Team phone at [phone] for questions and concerns.

## 2021-09-23 NOTE — Progress Notes (Signed)
PROGRESS NOTE    Jennifer Watkins  LTJ:[id number] DOB: 1946/07/08 DOA: 09/19/2021 PCP: Gayland Curry, DO    Brief Narrative:  75 year old with history of coronary artery disease, non-STEMI March 2022 with cardiac cath showing 100% occlusion of proximal circumflex on aspirin and Plavix, chronic systolic congestive heart failure on Entresto, metastatic ovarian cancer on chemotherapy on home palliative care, hypertension, hyperlipidemia and chronic bilateral obstructive uropathy/hydronephrosis presented with nausea and vomiting and abdominal pain. Has been on Vicodin at home and ran out.  In the emergency room, CTA negative for PE, chronic bilateral hydronephrosis, troponins elevated [phone].  EKG with no acute ST changes.  Cardiology consulted.  Initially started on heparin drip and admitted to the hospital.  CT scan of the abdomen pelvis consistent with widely metastatic ovarian cancer.   Assessment & Plan:   Principal Problem:   Pain of metastatic malignancy Active Problems:   ACS (acute coronary syndrome) (HCC)   NSTEMI (non-ST elevated myocardial infarction) (HCC)   Disseminated ovarian cancer, unspecified laterality (HCC)  Non-STEMI with history of coronary artery disease and circumflex occlusion: Currently chest pain-free.  Unlikely acute coronary syndrome. On aspirin Plavix and statin, on metoprolol.  Did not tolerate Entresto.  Discontinued. End-of-life with metastatic cancer, will not benefit with ongoing antiplatelet therapy and statin. Discontinue aspirin, discontinue Plavix.  We will discontinue atorvastatin.  Chronic systolic heart failure: Euvolemic.  Continue Lasix as much he tolerates.  Metastatic ovarian cancer: Metastatic pain.  Followed by oncology at [facility].  Widely metastatic disease and declined chemotherapy.  Pain control and palliation. Pain control, followed by palliative care team. Encouraged oral pain medication use so that we can have a regimen for  her to go home with. Bowel regimen. Home hospice consultation.  Anticipate discharge home tomorrow with hospice in place.   DVT prophylaxis:   None.  Patient declined.    Code Status: DNR Family Communication: None at the bedside Disposition Plan: Status is: Inpatient  Remains inpatient appropriate because: Significant abdominal pain.  Metastatic cancer pain.   Consultants:  Cardiology Palliative care  Procedures:  None  Antimicrobials:  None   Subjective: Patient was seen and examined.  She used IV Dilaudid 2 times last night.  Complains of episodic pain. She did not really try oral pain medications.  Does not feel comfortable going home today. Palliative care team working on adjusting her medications.  Encouraged to use oral pain medications so that we have a regimen for her to go home.   Objective: Vitals:   09/22/21 1625 09/22/21 2000 09/23/21 0503 09/23/21 1131  BP: (!) 105/59 106/62 117/68 118/69  Pulse: 85 88 95 71  Resp: 18  18 19   Temp: 98.6 F (37 C) 98.5 F (36.9 C) 98.6 F (37 C)   TempSrc: Oral Oral Oral   SpO2: 98% 98% 95% 100%  Weight:   66.9 kg     Intake/Output Summary (Last 24 hours) at 09/23/2021 1157 Last data filed at 09/23/2021 0841 Gross per 24 hour  Intake 236 ml  Output --  Net 236 ml   Filed Weights   09/21/21 0100 09/22/21 0400 09/23/21 0503  Weight: 66.5 kg 67.4 kg 66.9 kg    Examination:  General: Looks comfortable at rest.  Comfortably sitting. Cardiovascular: S1-S2 normal.  Regular rate rhythm. Respiratory: Bilateral clear.  No added sounds. Gastrointestinal: Soft.  Multiple palpable large firm tumors mostly on the left side of the abdomen.  Nontender. Ext: No swelling or edema.  No cyanosis. Neuro:  Alert oriented x4.  No focal deficits.    Data Reviewed: I have personally reviewed following labs and imaging studies  CBC: Recent Labs  Lab 09/19/21 0818 09/20/21 0418  WBC 10.7* 17.3*  NEUTROABS 9.7*  --   HGB  9.9* 10.5*  HCT 31.9* 33.3*  MCV 86.2 85.2  PLT 262 253   Basic Metabolic Panel: Recent Labs  Lab 09/19/21 0818 09/20/21 0418  NA 135 129*  K 3.7 4.1  CL 99 98  CO2 23 23  GLUCOSE 189* 144*  BUN 15 16  CREATININE 0.81 0.81  CALCIUM 9.7 8.7*   GFR: Estimated Creatinine Clearance: 56.8 mL/min (by C-G formula based on SCr of 0.81 mg/dL). Liver Function Tests: Recent Labs  Lab 09/19/21 0818  AST 28  ALT 15  ALKPHOS 105  BILITOT 0.7  PROT 6.8  ALBUMIN 3.8   Recent Labs  Lab 09/19/21 0818  LIPASE 24   No results for input(s): AMMONIA in the last 168 hours. Coagulation Profile: Recent Labs  Lab 09/19/21 1345  INR 1.2   Cardiac Enzymes: Recent Labs  Lab 09/19/21 1136  CKTOTAL 90   BNP (last 3 results) Recent Labs    04/12/21 1111  PROBNP 1,725*   HbA1C: No results for input(s): HGBA1C in the last 72 hours.  CBG: No results for input(s): GLUCAP in the last 168 hours. Lipid Profile: No results for input(s): CHOL, HDL, LDLCALC, TRIG, CHOLHDL, LDLDIRECT in the last 72 hours.  Thyroid Function Tests: No results for input(s): TSH, T4TOTAL, FREET4, T3FREE, THYROIDAB in the last 72 hours. Anemia Panel: No results for input(s): VITAMINB12, FOLATE, FERRITIN, TIBC, IRON, RETICCTPCT in the last 72 hours. Sepsis Labs: No results for input(s): PROCALCITON, LATICACIDVEN in the last 168 hours.  Recent Results (from the past 240 hour(s))  Resp Panel by RT-PCR (Flu A&B, Covid) Nasopharyngeal Swab     Status: None   Collection Time: 09/19/21  9:24 AM   Specimen: Nasopharyngeal Swab; Nasopharyngeal(NP) swabs in vial transport medium  Result Value Ref Range Status   SARS Coronavirus 2 by RT PCR NEGATIVE NEGATIVE Final    Comment: (NOTE) SARS-CoV-2 target nucleic acids are NOT DETECTED.  The SARS-CoV-2 RNA is generally detectable in upper respiratory specimens during the acute phase of infection. The lowest concentration of SARS-CoV-2 viral copies this assay can  detect is 138 copies/mL. A negative result does not preclude SARS-Cov-2 infection and should not be used as the sole basis for treatment or other patient management decisions. A negative result may occur with  improper specimen collection/handling, submission of specimen other than nasopharyngeal swab, presence of viral mutation(s) within the areas targeted by this assay, and inadequate number of viral copies(<138 copies/mL). A negative result must be combined with clinical observations, patient history, and epidemiological information. The expected result is Negative.  Fact Sheet for Patients:  EntrepreneurPulse.com.au  Fact Sheet for Healthcare Providers:  IncredibleEmployment.be  This test is no t yet approved or cleared by the [place] FDA and  has been authorized for detection and/or diagnosis of SARS-CoV-2 by FDA under an Emergency Use Authorization (EUA). This EUA will remain  in effect (meaning this test can be used) for the duration of the COVID-19 declaration under Section 564(b)(1) of the Act, 21 U.S.C.section 360bbb-3(b)(1), unless the authorization is terminated  or revoked sooner.       Influenza A by PCR NEGATIVE NEGATIVE Final   Influenza B by PCR NEGATIVE NEGATIVE Final    Comment: (NOTE) The Xpert Xpress SARS-CoV-2/FLU/RSV  plus assay is intended as an aid in the diagnosis of influenza from Nasopharyngeal swab specimens and should not be used as a sole basis for treatment. Nasal washings and aspirates are unacceptable for Xpert Xpress SARS-CoV-2/FLU/RSV testing.  Fact Sheet for Patients: EntrepreneurPulse.com.au  Fact Sheet for Healthcare Providers: IncredibleEmployment.be  This test is not yet approved or cleared by the [place] FDA and has been authorized for detection and/or diagnosis of SARS-CoV-2 by FDA under an Emergency Use Authorization (EUA). This EUA will remain in  effect (meaning this test can be used) for the duration of the COVID-19 declaration under Section 564(b)(1) of the Act, 21 U.S.C. section 360bbb-3(b)(1), unless the authorization is terminated or revoked.  Performed at [facility], [place] [address] [place], [place] [address]          Radiology Studies: ECHOCARDIOGRAM COMPLETE  Result Date: 09/21/2021    ECHOCARDIOGRAM REPORT   Patient Name:   Jennifer Watkins Date of Exam: 09/21/2021 Medical Rec #:  [id number]       Height:       63.0 in Accession #:    [id number]      Weight:       146.6 lb Date of Birth:  1946/08/02        BSA:          1.695 m Patient Age:    8 years        BP:           98/67 mmHg Patient Gender: F               HR:           88 bpm. Exam Location:  Inpatient Procedure: 2D Echo, Cardiac Doppler and Color Doppler Indications:    NTEMI  History:        Patient has prior history of Echocardiogram examinations, most                 recent 01/28/2021.  Sonographer:    Helmut Muster Referring Phys: [id number] [place] Comments: Pt declined constrast. IMPRESSIONS  1. Left ventricular ejection fraction, by estimation, is 35 to 40%. The left ventricle has moderately decreased function. The left ventricle demonstrates regional wall motion abnormalities (see scoring diagram/findings for description). Left ventricular  diastolic parameters are consistent with Grade II diastolic dysfunction (pseudonormalization).  2. Right ventricular systolic function is normal. The right ventricular size is normal.  3. Left atrial size was mild to moderately dilated.  4. The mitral valve is grossly normal. Mild mitral valve regurgitation. No evidence of mitral stenosis.  5. The aortic valve is tricuspid. Aortic valve regurgitation is mild. No aortic stenosis is present. Comparison(s): No significant change from prior study. Conclusion(s)/Recommendation(s): Findings consistent with ischemic cardiomyopathy. FINDINGS  Left Ventricle: Left  ventricular ejection fraction, by estimation, is 35 to 40%. The left ventricle has moderately decreased function. The left ventricle demonstrates regional wall motion abnormalities. The left ventricular internal cavity size was normal in size. There is no left ventricular hypertrophy. Left ventricular diastolic parameters are consistent with Grade II diastolic dysfunction (pseudonormalization).  LV Wall Scoring: The entire lateral wall is hypokinetic. Right Ventricle: The right ventricular size is normal. No increase in right ventricular wall thickness. Right ventricular systolic function is normal. Left Atrium: Left atrial size was mild to moderately dilated. Right Atrium: Right atrial size was normal in size. Prominent Eustachian valve. Pericardium: There is no evidence of pericardial effusion. Mitral Valve: The mitral  valve is grossly normal. Mild mitral valve regurgitation. No evidence of mitral valve stenosis. Tricuspid Valve: The tricuspid valve is grossly normal. Tricuspid valve regurgitation is mild . No evidence of tricuspid stenosis. Aortic Valve: The aortic valve is tricuspid. Aortic valve regurgitation is mild. Aortic regurgitation PHT measures 853 msec. No aortic stenosis is present. Pulmonic Valve: The pulmonic valve was grossly normal. Pulmonic valve regurgitation is trivial. No evidence of pulmonic stenosis. Aorta: The aortic root and ascending aorta are structurally normal, with no evidence of dilitation. Venous: The right lower pulmonary vein is normal. The inferior vena cava was not well visualized. IAS/Shunts: The atrial septum is grossly normal.  LEFT VENTRICLE PLAX 2D LVIDd:         3.70 cm     Diastology LVIDs:         2.60 cm     LV e' medial:    5.77 cm/s LV PW:         1.00 cm     LV E/e' medial:  18.7 LV IVS:        1.00 cm     LV e' lateral:   10.60 cm/s LVOT diam:     1.90 cm     LV E/e' lateral: 10.2 LV SV:         60 LV SV Index:   35 LVOT Area:     2.84 cm  LV Volumes (MOD) LV vol d,  MOD A2C: 63.8 ml LV vol d, MOD A4C: 80.2 ml LV vol s, MOD A2C: 23.0 ml LV vol s, MOD A4C: 42.7 ml LV SV MOD A2C:     40.8 ml LV SV MOD A4C:     80.2 ml LV SV MOD BP:      38.7 ml RIGHT VENTRICLE RV S prime:     11.30 cm/s TAPSE (M-mode): 2.0 cm LEFT ATRIUM             Index        RIGHT ATRIUM           Index LA diam:        4.00 cm 2.36 cm/m   RA Area:     10.90 cm LA Vol (A2C):   80.1 ml 47.27 ml/m  RA Volume:   20.70 ml  12.22 ml/m LA Vol (A4C):   66.2 ml 39.07 ml/m LA Biplane Vol: 77.2 ml 45.56 ml/m  AORTIC VALVE LVOT Vmax:   115.00 cm/s LVOT Vmean:  75.500 cm/s LVOT VTI:    0.212 m AI PHT:      853 msec  AORTA Ao Root diam: 3.40 cm Ao Asc diam:  3.90 cm MITRAL VALVE                TRICUSPID VALVE MV Area (PHT): 6.09 cm     TR Peak grad:   47.6 mmHg MV Decel Time: 125 msec     TR Vmax:        345.00 cm/s MV E velocity: 108.00 cm/s MV A velocity: 125.00 cm/s  SHUNTS MV E/A ratio:  0.86         Systemic VTI:  0.21 m                             Systemic Diam: 1.90 cm Eleonore Chiquito MD Electronically signed by Eleonore Chiquito MD Signature Date/Time: 09/21/2021/4:09:58 PM    Final         Scheduled Meds:  furosemide  40 mg Oral Daily   HYDROmorphone  2 mg Oral Q4H   metoprolol tartrate  37.5 mg Oral BID   senna-docusate  1 tablet Oral QHS   Continuous Infusions:  promethazine (PHENERGAN) injection (IM or IVPB) Stopped (09/20/21 0301)     LOS: 4 days    Time spent: 30 minutes   Barb Merino, MD [facility] Pager [phone]

## 2021-09-24 ENCOUNTER — Other Ambulatory Visit (HOSPITAL_COMMUNITY): Payer: Self-pay

## 2021-09-24 DIAGNOSIS — G893 Neoplasm related pain (acute) (chronic): Secondary | ICD-10-CM | POA: Diagnosis not present

## 2021-09-24 MED ORDER — HYDROMORPHONE HCL 2 MG PO TABS
2.0000 mg | ORAL_TABLET | ORAL | 0 refills | Status: AC
  Filled 2021-09-24: qty 30, 5d supply, fill #0

## 2021-09-24 NOTE — Discharge Summary (Signed)
Physician Discharge Summary  Jennifer Watkins ZOX:[id number] DOB: 1946-04-23 DOA: 09/19/2021  PCP: Gayland Curry, DO  Admit date: 09/19/2021 Discharge date: 09/24/2021  Admitted From: Home Disposition: Home with home hospice  Recommendations for Outpatient Follow-up:  As per hospice provider  Home Health: N/A Equipment/Devices: N/A  Discharge Condition: Stable CODE STATUS: DNR Diet recommendation: Regular diet.  Discharge summary:  75 year old with history of coronary artery disease, non-STEMI March 2022 with cardiac cath showing 100% occlusion of proximal circumflex on aspirin and Plavix, chronic systolic congestive heart failure on Entresto, metastatic ovarian cancer on chemotherapy on home palliative care, hypertension, hyperlipidemia and chronic bilateral obstructive uropathy/hydronephrosis presented with nausea and vomiting and abdominal pain. Has been on Vicodin at home and ran out.  In the emergency room, CTA negative for PE, chronic bilateral hydronephrosis, troponins elevated [phone].  EKG with no acute ST changes.  Cardiology consulted.  Initially started on heparin drip and admitted to the hospital.  CT scan of the abdomen pelvis consistent with widely metastatic ovarian cancer.  Admitted and remained in the hospital with difficulty to control her metastatic symptoms. Patient does have metastatic ovarian cancer and currently enrolled in home palliative care.  Declined palliative chemotherapy. Planning to enroll into home hospice today after discharge. Symptoms including abdominal pain well controlled on current regimen of Dilaudid 2 mg every 4 hours, she will use a stool softener along with it.  Further medications management will be done by hospice provider.  Patient is currently stable and independent and she can go home. She can continue to take Lasix for symptom control. She will not benefit with continuous use of aspirin, Plavix and a statin as she is reaching  end-of-life.  This will be discontinued. Blood pressures remain low normal, will continue metoprolol for rate control, however will discontinue Entresto as she is not tolerating.  Stable for discharge.    Discharge Diagnoses:  Principal Problem:   Pain of metastatic malignancy Active Problems:   ACS (acute coronary syndrome) (HCC)   NSTEMI (non-ST elevated myocardial infarction) ([place])   Disseminated ovarian cancer, unspecified laterality [facility])    Discharge Instructions  Discharge Instructions     Diet general   Complete by: As directed    Increase activity slowly   Complete by: As directed       Allergies as of 09/24/2021       Reactions   Oxycodone Nausea And Vomiting        Medication List     STOP taking these medications    Aspirin Low Dose 81 MG EC tablet Generic drug: aspirin   atorvastatin 80 MG tablet Commonly known as: LIPITOR   clopidogrel 75 MG tablet Commonly known as: PLAVIX   Entresto 24-26 MG Generic drug: sacubitril-valsartan   HYDROcodone-acetaminophen 5-325 MG tablet Commonly known as: NORCO/VICODIN       TAKE these medications    furosemide 40 MG tablet Commonly known as: LASIX Take 40 mg by mouth daily.   HYDROmorphone 2 MG tablet Commonly known as: DILAUDID Take 1 tablet (2 mg total) by mouth every 4 (four) hours for 5 days.   metoprolol succinate 50 MG 24 hr tablet Commonly known as: TOPROL-XL Take 1 tablet (50 mg total) by mouth daily. Take with or immediately following a meal.   nitroGLYCERIN 0.4 MG SL tablet Commonly known as: NITROSTAT Place 1 tablet (0.4 mg total) under the tongue every 5 (five) minutes x 3 doses as needed for chest pain.   senna-docusate 8.6-50 MG tablet  Commonly known as: Senokot-S Take 1 tablet by mouth at bedtime as needed for mild constipation or moderate constipation.        Follow-up Information     [facility] Follow up.   Specialty: Hospice and Palliative Medicine Why:  home hospice Contact information: [place] [address] [phone]               Allergies  Allergen Reactions   Oxycodone Nausea And Vomiting    Consultations: Palliative care Cardiology   Procedures/Studies: DG Chest 2 View  Result Date: 09/19/2021 CLINICAL DATA:  Shortness of breath. Patient is on palliative care for ovarian cancer. EXAM: CHEST - 2 VIEW COMPARISON:  Chest radiograph 01/27/2021; CT chest 04/19/2021 FINDINGS: Trace pleural fluid versus pleural thickening at the costophrenic angle with right basilar subsegmental atelectasis, similar to 01/27/2021. No new focal consolidation. No large volume pleural effusion. No pneumothorax. Heart is normal in size. No acute osseous abnormality. IMPRESSION: No acute cardiopulmonary abnormality. Trace pleural fluid versus pleural thickening at the costophrenic angles with right subsegmental basilar atelectasis, similar to prior exam 01/27/2021. Electronically Signed   By: Ileana Roup M.D.   On: 09/19/2021 09:03   CT Angio Chest PE W/Cm &/Or Wo Cm  Result Date: 09/19/2021 CLINICAL DATA:  Chest pain or shortness of breath, pleurisy or effusion suspected. Acute nonlocalized worsening chronic abdominal pain. Clinical concern for pulmonary embolism. EXAM: CT ANGIOGRAPHY CHEST CT ABDOMEN AND PELVIS WITH CONTRAST TECHNIQUE: Multidetector CT imaging of the chest was performed using the standard protocol during bolus administration of intravenous contrast. Multiplanar CT image reconstructions and MIPs were obtained to evaluate the vascular anatomy. Multidetector CT imaging of the abdomen and pelvis was performed using the standard protocol during bolus administration of intravenous contrast. CONTRAST:  12mL OMNIPAQUE IOHEXOL 350 MG/ML SOLN COMPARISON:  CTs of the chest, abdomen and pelvis 04/19/2021. FINDINGS: CTA CHEST FINDINGS Cardiovascular: The pulmonary arteries are well opacified with contrast to the level  of the subsegmental branches. There is no evidence of acute pulmonary embolism. There is limited opacification of the systemic arteries which demonstrate no gross acute abnormality. There is mild atherosclerosis of the aorta, great vessels and coronary arteries. The heart size is normal. There is no pericardial effusion. Mediastinum/Nodes: Anasarca with generalized subcutaneous and mediastinal edema. There is ill-defined fluid tracking superiorly from the abdomen into the retrocrural space. No discrete enlarged mediastinal, hilar or axillary lymph nodes are seen. The thyroid gland, trachea and esophagus demonstrate no significant findings. Lungs/Pleura: New small bilateral pleural effusions. There are new streaky opacities at both lung bases which are most consistent with atelectasis. In addition, there are patchy ground-glass opacities elsewhere, greatest in the right upper lobe, which may reflect edema. No confluent airspace opacity, dominant mass or suspicious pulmonary nodularity identified. Musculoskeletal/Chest wall: No chest wall mass or suspicious osseous findings. Grossly stable degenerative changes in the spine and both shoulders. CT ABDOMEN AND PELVIS FINDINGS Hepatobiliary: The liver is normal in density without suspicious focal abnormality. There is extrinsic mass effect on the right hepatic lobe by the enlarging intra-masses described below. Small gallstones are noted. No evidence of gallbladder wall thickening or significant biliary dilatation. Pancreas: Displaced by the intra-abdominal masses. No pancreatic mass or ductal dilatation demonstrated. Spleen: Normal in size without focal abnormality. Adrenals/Urinary Tract: As before, the adrenal glands are not well visualized. There is persistent bilateral hydronephrosis with delayed contrast excretion, similar to previous study. 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 There is a chronic obstructing  1 cm calculus at the left ureteropelvic junction, similar to previous study. The bladder appears decompressed and partly obscured by artifact from the right total hip arthroplasty. No bladder abnormality identified. Stomach/Bowel: No enteric contrast administered. There is peripheral displacement of bowel by the multiple enlarging intra-abdominal masses. The stomach appears unremarkable for its degree of distension. No evidence of bowel wall thickening, distention or surrounding inflammatory change. Vascular/Lymphatic: Bulky adenopathy again noted in the upper retroperitoneum with a retroaortic nodal mass measuring up to 4.9 x 3.5 cm on image 31/4. These nodal masses have mildly progressed. No acute vascular findings are seen. The portal, superior mesenteric and splenic veins appear patent. There is aortic and branch vessel atherosclerosis. Reproductive: Status post hysterectomy. Other: There are multiple enlarging complex intra-abdominal masses. A predominantly solid lesion in the left mid abdomen measures 17.0 x 14.7 cm on image 51/4 (previously 14.5 x 12.8 cm). A more complex cystic appearing lesion in the right mid abdomen measures 18.4 x 11.0 cm on image 34/4 (previously 17.8 x 11.5 cm). Multiple additional complex solid and cystic lesions have also mildly enlarged. There is mild ascites. There is progressive generalized anasarca with edema throughout the subcutaneous and deep fat. Musculoskeletal: No apparent osseous metastases or acute osseous findings. Status post right total hip arthroplasty. Multilevel lumbar spondylosis is grossly unchanged. Review of the MIP images confirms the above findings. IMPRESSION: 1. No evidence of acute pulmonary embolism. 2. Anasarca with generalized soft tissue edema, new pleural effusions and mildly increased abdominal ascites. 3. Further progression in multiple complex intra-abdominal and retroperitoneal masses consistent with progressive widely metastatic ovarian cancer as  correlated with previous radiology reports. No definite thoracic metastatic disease. 4. Increased atelectasis at both lung bases with patchy ground-glass opacities in both lungs which may reflect edema. 5. Chronic bilateral hydronephrosis with chronic obstructing proximal left ureteral calculus. 6. Cholelithiasis. Electronically Signed   By: Richardean Sale M.D.   On: 09/19/2021 12:34   CT Abdomen Pelvis W Contrast  Result Date: 09/19/2021 CLINICAL DATA:  Chest pain or shortness of breath, pleurisy or effusion suspected. Acute nonlocalized worsening chronic abdominal pain. Clinical concern for pulmonary embolism. EXAM: CT ANGIOGRAPHY CHEST CT ABDOMEN AND PELVIS WITH CONTRAST TECHNIQUE: Multidetector CT imaging of the chest was performed using the standard protocol during bolus administration of intravenous contrast. Multiplanar CT image reconstructions and MIPs were obtained to evaluate the vascular anatomy. Multidetector CT imaging of the abdomen and pelvis was performed using the standard protocol during bolus administration of intravenous contrast. CONTRAST:  159mL OMNIPAQUE IOHEXOL 350 MG/ML SOLN COMPARISON:  CTs of the chest, abdomen and pelvis 04/19/2021. FINDINGS: CTA CHEST FINDINGS Cardiovascular: The pulmonary arteries are well opacified with contrast to the level of the subsegmental branches. There is no evidence of acute pulmonary embolism. There is limited opacification of the systemic arteries which demonstrate no gross acute abnormality. There is mild atherosclerosis of the aorta, great vessels and coronary arteries. The heart size is normal. There is no pericardial effusion. Mediastinum/Nodes: Anasarca with generalized subcutaneous and mediastinal edema. There is ill-defined fluid tracking superiorly from the abdomen into the retrocrural space. No discrete enlarged mediastinal, hilar or axillary lymph nodes are seen. The thyroid gland, trachea and esophagus demonstrate no significant findings.  Lungs/Pleura: New small bilateral pleural effusions. There are new streaky opacities at both lung bases which are most consistent with atelectasis. In addition, there are patchy ground-glass opacities elsewhere, greatest in the right upper lobe, which may  reflect edema. No confluent airspace opacity, dominant mass or suspicious pulmonary nodularity identified. Musculoskeletal/Chest wall: No chest wall mass or suspicious osseous findings. Grossly stable degenerative changes in the spine and both shoulders. CT ABDOMEN AND PELVIS FINDINGS Hepatobiliary: The liver is normal in density without suspicious focal abnormality. There is extrinsic mass effect on the right hepatic lobe by the enlarging intra-masses described below. Small gallstones are noted. No evidence of gallbladder wall thickening or significant biliary dilatation. Pancreas: Displaced by the intra-abdominal masses. No pancreatic mass or ductal dilatation demonstrated. Spleen: Normal in size without focal abnormality. Adrenals/Urinary Tract: As before, the adrenal glands are not well visualized. There is persistent bilateral hydronephrosis with delayed contrast excretion, similar to previous study. 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 There is a chronic obstructing 1 cm calculus at the left ureteropelvic junction, similar to previous study. The bladder appears decompressed and partly obscured by artifact from the right total hip arthroplasty. No bladder abnormality identified. Stomach/Bowel: No enteric contrast administered. There is peripheral displacement of bowel by the multiple enlarging intra-abdominal masses. The stomach appears unremarkable for its degree of distension. No evidence of bowel wall thickening, distention or surrounding inflammatory change. Vascular/Lymphatic: Bulky adenopathy again noted in the upper retroperitoneum with a retroaortic nodal mass measuring up to 4.9 x 3.5 cm on  image 31/4. These nodal masses have mildly progressed. No acute vascular findings are seen. The portal, superior mesenteric and splenic veins appear patent. There is aortic and branch vessel atherosclerosis. Reproductive: Status post hysterectomy. Other: There are multiple enlarging complex intra-abdominal masses. A predominantly solid lesion in the left mid abdomen measures 17.0 x 14.7 cm on image 51/4 (previously 14.5 x 12.8 cm). A more complex cystic appearing lesion in the right mid abdomen measures 18.4 x 11.0 cm on image 34/4 (previously 17.8 x 11.5 cm). Multiple additional complex solid and cystic lesions have also mildly enlarged. There is mild ascites. There is progressive generalized anasarca with edema throughout the subcutaneous and deep fat. Musculoskeletal: No apparent osseous metastases or acute osseous findings. Status post right total hip arthroplasty. Multilevel lumbar spondylosis is grossly unchanged. Review of the MIP images confirms the above findings. IMPRESSION: 1. No evidence of acute pulmonary embolism. 2. Anasarca with generalized soft tissue edema, new pleural effusions and mildly increased abdominal ascites. 3. Further progression in multiple complex intra-abdominal and retroperitoneal masses consistent with progressive widely metastatic ovarian cancer as correlated with previous radiology reports. No definite thoracic metastatic disease. 4. Increased atelectasis at both lung bases with patchy ground-glass opacities in both lungs which may reflect edema. 5. Chronic bilateral hydronephrosis with chronic obstructing proximal left ureteral calculus. 6. Cholelithiasis. Electronically Signed   By: Richardean Sale M.D.   On: 09/19/2021 12:34   ECHOCARDIOGRAM COMPLETE  Result Date: 09/21/2021    ECHOCARDIOGRAM REPORT   Patient Name:   Jennifer Watkins Date of Exam: 09/21/2021 Medical Rec #:  [id number]       Height:       63.0 in Accession #:    [id number]      Weight:       146.6 lb Date of  Birth:  Jul 16, 1946        BSA:          1.695 m Patient Age:    75 years        BP:           98/67 mmHg Patient Gender: F  HR:           88 bpm. Exam Location:  Inpatient Procedure: 2D Echo, Cardiac Doppler and Color Doppler Indications:    NTEMI  History:        Patient has prior history of Echocardiogram examinations, most                 recent 01/28/2021.  Sonographer:    Helmut Muster Referring Phys: [id number] [place] Comments: Pt declined constrast. IMPRESSIONS  1. Left ventricular ejection fraction, by estimation, is 35 to 40%. The left ventricle has moderately decreased function. The left ventricle demonstrates regional wall motion abnormalities (see scoring diagram/findings for description). Left ventricular  diastolic parameters are consistent with Grade II diastolic dysfunction (pseudonormalization).  2. Right ventricular systolic function is normal. The right ventricular size is normal.  3. Left atrial size was mild to moderately dilated.  4. The mitral valve is grossly normal. Mild mitral valve regurgitation. No evidence of mitral stenosis.  5. The aortic valve is tricuspid. Aortic valve regurgitation is mild. No aortic stenosis is present. Comparison(s): No significant change from prior study. Conclusion(s)/Recommendation(s): Findings consistent with ischemic cardiomyopathy. FINDINGS  Left Ventricle: Left ventricular ejection fraction, by estimation, is 35 to 40%. The left ventricle has moderately decreased function. The left ventricle demonstrates regional wall motion abnormalities. The left ventricular internal cavity size was normal in size. There is no left ventricular hypertrophy. Left ventricular diastolic parameters are consistent with Grade II diastolic dysfunction (pseudonormalization).  LV Wall Scoring: The entire lateral wall is hypokinetic. Right Ventricle: The right ventricular size is normal. No increase in right ventricular wall thickness. Right ventricular  systolic function is normal. Left Atrium: Left atrial size was mild to moderately dilated. Right Atrium: Right atrial size was normal in size. Prominent Eustachian valve. Pericardium: There is no evidence of pericardial effusion. Mitral Valve: The mitral valve is grossly normal. Mild mitral valve regurgitation. No evidence of mitral valve stenosis. Tricuspid Valve: The tricuspid valve is grossly normal. Tricuspid valve regurgitation is mild . No evidence of tricuspid stenosis. Aortic Valve: The aortic valve is tricuspid. Aortic valve regurgitation is mild. Aortic regurgitation PHT measures 853 msec. No aortic stenosis is present. Pulmonic Valve: The pulmonic valve was grossly normal. Pulmonic valve regurgitation is trivial. No evidence of pulmonic stenosis. Aorta: The aortic root and ascending aorta are structurally normal, with no evidence of dilitation. Venous: The right lower pulmonary vein is normal. The inferior vena cava was not well visualized. IAS/Shunts: The atrial septum is grossly normal.  LEFT VENTRICLE PLAX 2D LVIDd:         3.70 cm     Diastology LVIDs:         2.60 cm     LV e' medial:    5.77 cm/s LV PW:         1.00 cm     LV E/e' medial:  18.7 LV IVS:        1.00 cm     LV e' lateral:   10.60 cm/s LVOT diam:     1.90 cm     LV E/e' lateral: 10.2 LV SV:         60 LV SV Index:   35 LVOT Area:     2.84 cm  LV Volumes (MOD) LV vol d, MOD A2C: 63.8 ml LV vol d, MOD A4C: 80.2 ml LV vol s, MOD A2C: 23.0 ml LV vol s, MOD A4C: 42.7 ml LV SV MOD A2C:  40.8 ml LV SV MOD A4C:     80.2 ml LV SV MOD BP:      38.7 ml RIGHT VENTRICLE RV S prime:     11.30 cm/s TAPSE (M-mode): 2.0 cm LEFT ATRIUM             Index        RIGHT ATRIUM           Index LA diam:        4.00 cm 2.36 cm/m   RA Area:     10.90 cm LA Vol (A2C):   80.1 ml 47.27 ml/m  RA Volume:   20.70 ml  12.22 ml/m LA Vol (A4C):   66.2 ml 39.07 ml/m LA Biplane Vol: 77.2 ml 45.56 ml/m  AORTIC VALVE LVOT Vmax:   115.00 cm/s LVOT Vmean:  75.500  cm/s LVOT VTI:    0.212 m AI PHT:      853 msec  AORTA Ao Root diam: 3.40 cm Ao Asc diam:  3.90 cm MITRAL VALVE                TRICUSPID VALVE MV Area (PHT): 6.09 cm     TR Peak grad:   47.6 mmHg MV Decel Time: 125 msec     TR Vmax:        345.00 cm/s MV E velocity: 108.00 cm/s MV A velocity: 125.00 cm/s  SHUNTS MV E/A ratio:  0.86         Systemic VTI:  0.21 m                             Systemic Diam: 1.90 cm Eleonore Chiquito MD Electronically signed by Eleonore Chiquito MD Signature Date/Time: 09/21/2021/4:09:58 PM    Final    VAS [place] LOWER EXTREMITY VENOUS (DVT) (ONLY [facility] & [facility])  Result Date: 09/19/2021  Lower Venous DVT Study Patient Name:  SOLARIS KRAM  Date of Exam:   09/19/2021 Medical Rec #: [id number]        Accession #:    [id number] Date of Birth: Feb 10, 1946         Patient Gender: F Patient Age:   54 years Exam Location:  [facility] Procedure:      VAS [place] LOWER EXTREMITY VENOUS (DVT) Referring Phys: RILEY RANSOM --------------------------------------------------------------------------------  Indications: Swelling, and Edema.  Comparison Study: no prior Performing Technologist: Archie Patten RVS  Examination Guidelines: A complete evaluation includes B-mode imaging, spectral Doppler, color Doppler, and power Doppler as needed of all accessible portions of each vessel. Bilateral testing is considered an integral part of a complete examination. Limited examinations for reoccurring indications may be performed as noted. The reflux portion of the exam is performed with the patient in reverse Trendelenburg.  +-----+---------------+---------+-----------+----------+--------------+ RIGHTCompressibilityPhasicitySpontaneityPropertiesThrombus Aging +-----+---------------+---------+-----------+----------+--------------+ CFV  Full           Yes      Yes                                 +-----+---------------+---------+-----------+----------+--------------+    +---------+---------------+---------+-----------+----------+--------------+ LEFT     CompressibilityPhasicitySpontaneityPropertiesThrombus Aging +---------+---------------+---------+-----------+----------+--------------+ CFV      Full           Yes      Yes                                 +---------+---------------+---------+-----------+----------+--------------+  SFJ      Full                                                        +---------+---------------+---------+-----------+----------+--------------+ FV Prox  Full                                                        +---------+---------------+---------+-----------+----------+--------------+ FV Mid   Full                                                        +---------+---------------+---------+-----------+----------+--------------+ FV DistalFull                                                        +---------+---------------+---------+-----------+----------+--------------+ PFV      Full                                                        +---------+---------------+---------+-----------+----------+--------------+ POP      Full           Yes      Yes                                 +---------+---------------+---------+-----------+----------+--------------+ PTV      Full                                                        +---------+---------------+---------+-----------+----------+--------------+     Summary: RIGHT: - No evidence of common femoral vein obstruction.  LEFT: - There is no evidence of deep vein thrombosis in the lower extremity.  - No cystic structure found in the popliteal fossa.  *See table(s) above for measurements and observations. Electronically signed by Deitra Mayo MD on 09/19/2021 at 1:30:45 PM.    Final    (Echo, Carotid, EGD, Colonoscopy, ERCP)    Subjective: Patient seen and examined.  Eating breakfast.  Denies any complaints.  Stated no abdominal pain for  the last 24 hours after using around-the-clock oral Dilaudid.  Eager to go home.   Discharge Exam: Vitals:   09/23/21 2021 09/24/21 0500  BP: (!) 141/77 133/78  Pulse: 91 79  Resp: 19 18  Temp: 98.2 F (36.8 C) 98.2 F (36.8 C)  SpO2: 97% 95%   Vitals:   09/23/21 0503 09/23/21 1131 09/23/21 2021 09/24/21 0500  BP: 117/68 118/69 (!) 141/77 133/78  Pulse: 95 71 91 79  Resp: 18 19 19  18  Temp: 98.6 F (37 C) 98.3 F (36.8 C) 98.2 F (36.8 C) 98.2 F (36.8 C)  TempSrc: Oral   Oral  SpO2: 95% 100% 97% 95%  Weight: 66.9 kg   65.2 kg    General: Pt is alert, awake, not in acute distress Cachectic.  Thin and frail.  Walking around in the hallway. Cardiovascular: RRR, S1/S2 +, no rubs, no gallops Respiratory: CTA bilaterally, no wheezing, no rhonchi Abdominal: Palpable large tumors in the abdomen.  Bowel sounds present.  Soft and nontender. Extremities: no edema, no cyanosis    The results of significant diagnostics from this hospitalization (including imaging, microbiology, ancillary and laboratory) are listed below for reference.     Microbiology: Recent Results (from the past 240 hour(s))  Resp Panel by RT-PCR (Flu A&B, Covid) Nasopharyngeal Swab     Status: None   Collection Time: 09/19/21  9:24 AM   Specimen: Nasopharyngeal Swab; Nasopharyngeal(NP) swabs in vial transport medium  Result Value Ref Range Status   SARS Coronavirus 2 by RT PCR NEGATIVE NEGATIVE Final    Comment: (NOTE) SARS-CoV-2 target nucleic acids are NOT DETECTED.  The SARS-CoV-2 RNA is generally detectable in upper respiratory specimens during the acute phase of infection. The lowest concentration of SARS-CoV-2 viral copies this assay can detect is 138 copies/mL. A negative result does not preclude SARS-Cov-2 infection and should not be used as the sole basis for treatment or other patient management decisions. A negative result may occur with  improper specimen collection/handling, submission of  specimen other than nasopharyngeal swab, presence of viral mutation(s) within the areas targeted by this assay, and inadequate number of viral copies(<138 copies/mL). A negative result must be combined with clinical observations, patient history, and epidemiological information. The expected result is Negative.  Fact Sheet for Patients:  EntrepreneurPulse.com.au  Fact Sheet for Healthcare Providers:  IncredibleEmployment.be  This test is no t yet approved or cleared by the [place] FDA and  has been authorized for detection and/or diagnosis of SARS-CoV-2 by FDA under an Emergency Use Authorization (EUA). This EUA will remain  in effect (meaning this test can be used) for the duration of the COVID-19 declaration under Section 564(b)(1) of the Act, 21 U.S.C.section 360bbb-3(b)(1), unless the authorization is terminated  or revoked sooner.       Influenza A by PCR NEGATIVE NEGATIVE Final   Influenza B by PCR NEGATIVE NEGATIVE Final    Comment: (NOTE) The Xpert Xpress SARS-CoV-2/FLU/RSV plus assay is intended as an aid in the diagnosis of influenza from Nasopharyngeal swab specimens and should not be used as a sole basis for treatment. Nasal washings and aspirates are unacceptable for Xpert Xpress SARS-CoV-2/FLU/RSV testing.  Fact Sheet for Patients: EntrepreneurPulse.com.au  Fact Sheet for Healthcare Providers: IncredibleEmployment.be  This test is not yet approved or cleared by the [place] FDA and has been authorized for detection and/or diagnosis of SARS-CoV-2 by FDA under an Emergency Use Authorization (EUA). This EUA will remain in effect (meaning this test can be used) for the duration of the COVID-19 declaration under Section 564(b)(1) of the Act, 21 U.S.C. section 360bbb-3(b)(1), unless the authorization is terminated or revoked.  Performed at [facility], [place] [address],  [place], [place] [address]      Labs: BNP (last 3 results) Recent Labs    01/27/21 1821  BNP 416.6*   Basic Metabolic Panel: Recent Labs  Lab 09/19/21 0818 09/20/21 0418  NA 135 129*  K 3.7 4.1  CL  99 98  CO2 23 23  GLUCOSE 189* 144*  BUN 15 16  CREATININE 0.81 0.81  CALCIUM 9.7 8.7*   Liver Function Tests: Recent Labs  Lab 09/19/21 0818  AST 28  ALT 15  ALKPHOS 105  BILITOT 0.7  PROT 6.8  ALBUMIN 3.8   Recent Labs  Lab 09/19/21 0818  LIPASE 24   No results for input(s): AMMONIA in the last 168 hours. CBC: Recent Labs  Lab 09/19/21 0818 09/20/21 0418  WBC 10.7* 17.3*  NEUTROABS 9.7*  --   HGB 9.9* 10.5*  HCT 31.9* 33.3*  MCV 86.2 85.2  PLT 262 209   Cardiac Enzymes: Recent Labs  Lab 09/19/21 1136  CKTOTAL 90   BNP: Invalid input(s): POCBNP CBG: No results for input(s): GLUCAP in the last 168 hours. D-Dimer No results for input(s): DDIMER in the last 72 hours. Hgb A1c No results for input(s): HGBA1C in the last 72 hours. Lipid Profile No results for input(s): CHOL, HDL, LDLCALC, TRIG, CHOLHDL, LDLDIRECT in the last 72 hours. Thyroid function studies No results for input(s): TSH, T4TOTAL, T3FREE, THYROIDAB in the last 72 hours.  Invalid input(s): FREET3 Anemia work up No results for input(s): VITAMINB12, FOLATE, FERRITIN, TIBC, IRON, RETICCTPCT in the last 72 hours. Urinalysis    Component Value Date/Time   COLORURINE AMBER (A) 09/19/2021 1000   APPEARANCEUR HAZY (A) 09/19/2021 1000   LABSPEC 1.020 09/19/2021 1000   PHURINE 5.0 09/19/2021 1000   GLUCOSEU NEGATIVE 09/19/2021 1000   HGBUR NEGATIVE 09/19/2021 1000   BILIRUBINUR NEGATIVE 09/19/2021 1000   KETONESUR 5 (A) 09/19/2021 1000   PROTEINUR 100 (A) 09/19/2021 1000   UROBILINOGEN 1.0 12/20/2007 1035   NITRITE NEGATIVE 09/19/2021 1000   LEUKOCYTESUR TRACE (A) 09/19/2021 1000   Sepsis Labs Invalid input(s): PROCALCITONIN,  WBC,  LACTICIDVEN Microbiology Recent Results (from the  past 240 hour(s))  Resp Panel by RT-PCR (Flu A&B, Covid) Nasopharyngeal Swab     Status: None   Collection Time: 09/19/21  9:24 AM   Specimen: Nasopharyngeal Swab; Nasopharyngeal(NP) swabs in vial transport medium  Result Value Ref Range Status   SARS Coronavirus 2 by RT PCR NEGATIVE NEGATIVE Final    Comment: (NOTE) SARS-CoV-2 target nucleic acids are NOT DETECTED.  The SARS-CoV-2 RNA is generally detectable in upper respiratory specimens during the acute phase of infection. The lowest concentration of SARS-CoV-2 viral copies this assay can detect is 138 copies/mL. A negative result does not preclude SARS-Cov-2 infection and should not be used as the sole basis for treatment or other patient management decisions. A negative result may occur with  improper specimen collection/handling, submission of specimen other than nasopharyngeal swab, presence of viral mutation(s) within the areas targeted by this assay, and inadequate number of viral copies(<138 copies/mL). A negative result must be combined with clinical observations, patient history, and epidemiological information. The expected result is Negative.  Fact Sheet for Patients:  EntrepreneurPulse.com.au  Fact Sheet for Healthcare Providers:  IncredibleEmployment.be  This test is no t yet approved or cleared by the [place] FDA and  has been authorized for detection and/or diagnosis of SARS-CoV-2 by FDA under an Emergency Use Authorization (EUA). This EUA will remain  in effect (meaning this test can be used) for the duration of the COVID-19 declaration under Section 564(b)(1) of the Act, 21 U.S.C.section 360bbb-3(b)(1), unless the authorization is terminated  or revoked sooner.       Influenza A by PCR NEGATIVE NEGATIVE Final   Influenza B by PCR  NEGATIVE NEGATIVE Final    Comment: (NOTE) The Xpert Xpress SARS-CoV-2/FLU/RSV plus assay is intended as an aid in the diagnosis of  influenza from Nasopharyngeal swab specimens and should not be used as a sole basis for treatment. Nasal washings and aspirates are unacceptable for Xpert Xpress SARS-CoV-2/FLU/RSV testing.  Fact Sheet for Patients: EntrepreneurPulse.com.au  Fact Sheet for Healthcare Providers: IncredibleEmployment.be  This test is not yet approved or cleared by the [place] FDA and has been authorized for detection and/or diagnosis of SARS-CoV-2 by FDA under an Emergency Use Authorization (EUA). This EUA will remain in effect (meaning this test can be used) for the duration of the COVID-19 declaration under Section 564(b)(1) of the Act, 21 U.S.C. section 360bbb-3(b)(1), unless the authorization is terminated or revoked.  Performed at [facility], [place] [address] [place], [place] [address]      Time coordinating discharge:  35 minutes  SIGNED:   Barb Merino, MD  [facility] 09/24/2021, 7:51 AM

## 2021-09-24 NOTE — Plan of Care (Signed)
  Problem: Health Behavior/Discharge Planning: Goal: Ability to manage health-related needs will improve Outcome: Adequate for Discharge   Problem: Clinical Measurements: Goal: Ability to maintain clinical measurements within normal limits will improve Outcome: Adequate for Discharge Goal: Will remain free from infection Outcome: Adequate for Discharge Goal: Diagnostic test results will improve Outcome: Adequate for Discharge Goal: Respiratory complications will improve Outcome: Adequate for Discharge   Problem: Nutrition: Goal: Adequate nutrition will be maintained Outcome: Adequate for Discharge   Problem: Coping: Goal: Level of anxiety will decrease Outcome: Adequate for Discharge

## 2021-09-24 NOTE — TOC Transition Note (Signed)
Transition of Care [facility]) - CM/SW Discharge Note   Patient Details  Name: Jennifer Watkins MRN: [id number] Date of Birth: 12/05/1945  [facility]) CM/SW Contact:  Zenon Mayo, RN Phone Number: 09/24/2021, 9:28 AM   Clinical Narrative:    Patient is for dc today, home with hospice, she has transport home, NCM notified Maryann with [facility] of dc today.     Final next level of care: Home w Hospice Care Barriers to Discharge: No Barriers Identified   Patient Goals and CMS Choice Patient states their goals for this hospitalization and ongoing recovery are:: home with hospice CMS Medicare.gov Compare Post Acute Care list provided to:: Patient Choice offered to / list presented to : Patient  Discharge Placement                       Discharge Plan and Services                  DME Agency: NA       HH Arranged: RN [place] Agency:  Lonia Chimera) Date HH Agency Contacted: 09/21/21 Time HH Agency Contacted: 1000 Representative spoke with at [place]: [place]) Interventions     Readmission Risk Interventions No flowsheet data found.

## 2021-10-11 ENCOUNTER — Other Ambulatory Visit: Payer: Medicare Other

## 2021-10-18 ENCOUNTER — Ambulatory Visit: Payer: Self-pay | Admitting: Cardiology

## 2021-10-18 ENCOUNTER — Other Ambulatory Visit: Payer: Self-pay

## 2021-10-18 ENCOUNTER — Encounter: Payer: Self-pay | Admitting: Cardiology

## 2021-10-18 VITALS — BP 136/85 | HR 101 | Temp 98.0°F | Resp 16 | Ht 63.0 in | Wt 147.0 lb

## 2021-10-18 DIAGNOSIS — I25810 Atherosclerosis of coronary artery bypass graft(s) without angina pectoris: Secondary | ICD-10-CM

## 2021-10-18 NOTE — Progress Notes (Signed)
Follow up visit  Subjective:   Jennifer Watkins, female    DOB: 1946-06-25, 75 y.o.   MRN: [id number]   Chief Complaint  Patient presents with   HFrEF   Follow-up    75 y.o. 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 She was discharged home with hospice. Her cardiac medications, including antiplatelet agents, were rightly discontinued.   Patient is in good spirits, denies ay obvious complaints. She is keeping herself busy pursuing her hobbies, such as painting.    Current Outpatient Medications on File Prior to Visit  Medication Sig Dispense Refill   DILAUDID 2 MG tablet Take 2 mg by mouth every 4 (four) hours as needed.     furosemide (LASIX) 40 MG tablet Take 40 mg by mouth daily.     nitroGLYCERIN (NITROSTAT) 0.4 MG SL tablet Place 1 tablet (0.4 mg total) under the tongue every 5 (five) minutes x 3 doses as needed for chest pain. 30 tablet 1   senna-docusate (SENOKOT-S) 8.6-50 MG tablet Take 1 tablet by mouth at bedtime as needed for mild constipation or moderate constipation. 30 tablet 0   No current facility-administered medications on file prior to visit.    Cardiovascular & other pertient studies:  Echocardiogram 08/2021:  1. Left ventricular ejection fraction, by estimation, is 35 to 40%. The  left ventricle has moderately decreased function. The left ventricle  demonstrates regional wall motion abnormalities (see scoring  diagram/findings for description). Left ventricular   diastolic parameters are consistent with Grade II diastolic dysfunction  (pseudonormalization).   2. Right ventricular systolic function is normal. The right ventricular  size is normal.   3. Left atrial  size was mild to moderately dilated.   4. The mitral valve is grossly normal. Mild mitral valve regurgitation.  No evidence of mitral stenosis.   5. The aortic valve is tricuspid. Aortic valve regurgitation is mild. No  aortic stenosis is present.   Comparison(s): No significant change from prior study.   Conclusion(s)/Recommendation(s): Findings consistent with ischemic  cardiomyopathy.   Echocardiogram 04/08/2021:  Moderately depressed LV systolic function with visual EF 35-40%. Left  ventricle cavity is normal in size. Doppler evidence of grade II  (pseudonormal) diastolic dysfunction, normal LAP. Left ventricle regional  wall motion findings: Basal anterolateral, Mid anterolateral and Apical  lateral hypokinesis. Basal inferolateral and Mid inferolateral akinesis.  Left atrial cavity is mildly dilated.  Mild (Grade I) aortic regurgitation.  Mild (Grade I) mitral regurgitation.  Mild tricuspid regurgitation. Mild pulmonary hypertension. RVSP measures  37 mmHg.  Prior study dated 01/28/2021: LVEF 40-45%, global hypokinesis, mild LVH, GLS  -5.5%, PASP 49 mmHg, mild MR.   EKG 03/29/2021: Sinus rhythm Nonspecific ST-T changes inferolateral leads   Coronary angiography 01/28/2021: LM: Normal LAD: Mid diffuse 50% disease        Small D2 ostial 70% stenosis LCx: Medium sized high OM1 with proximal 100% thrombotic occlusion        Likely culprit vessel with completed infarct RCA: Small. Mild luminal irregularities   Late presentation with completed infarct from occlusion of high OM1 vessel No benefit from revascularization given late presentation Moderate non-bstructive disease in LAD Normal LVEDP    Recent labs: 06/15/2021: Glucose 166, BUN/Cr 11/0.52. EGFR >60. Na/K 135/3.6. Rest  of the CMP normal Chol 142, TG 119, HDL 36, LDL 84   04/12/2021: Glucose 116, BUN/Cr 13/0.69. EGFR >60. Na/K 134/4.3. AlKP 239. Rest of the CMP normal NT pro BNP 1725 (0-301) H/H 11/34. MCV 86.  Platelets 198   3/22/20222: Glucose 94, BUN/Cr 12/0.7. EGFR 91. Na/K 139/4.9.  Chol 150, TG 106, HDL 32, LDL 98  01/29/2021: Glucose 147, BUN/Cr 14/0.64. EGFR >60. Na/K 135/3.0. H/H 11/32. MCV 83. Platelets 207 HbA1C N/A Chol 240, TG 128, HDL 45, LDL 169 TSH N/A    Review of Systems  Cardiovascular:  Negative for chest pain, dyspnea on exertion, leg swelling, palpitations and syncope.  Neurological:  Positive for light-headedness (Now resolved).        Vitals:   10/18/21 1040  BP: 136/85  Pulse: (!) 101  Resp: 16  Temp: 98 F (36.7 C)  SpO2: 97%      Body mass index is 26.04 kg/m. Filed Weights   10/18/21 1040  Weight: 147 lb (66.7 kg)     Objective:   Physical Exam Vitals and nursing note reviewed.  Constitutional:      General: She is not in acute distress. Neck:     Vascular: No JVD.  Cardiovascular:     Rate and Rhythm: Normal rate and regular rhythm.     Heart sounds: Normal heart sounds. No murmur heard. Pulmonary:     Effort: Pulmonary effort is normal.     Breath sounds: Normal breath sounds. No wheezing or rales.  Abdominal:     Comments: Firm b/l abdominal masses  Musculoskeletal:     Right lower leg: Edema (1+) present.     Left lower leg: Edema (1+) present.          Assessment & Recommendations:   75 y.o. 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 Not a candidate for any cardiac intervention. I wished her well. I will be available for any questions. No regular follow up scheduled a this time.    Nigel Mormon, MD Pager: [phone] Office: [phone]

## 2021-11-19 ENCOUNTER — Encounter: Payer: Self-pay | Admitting: Cardiology

## 2022-01-18 ENCOUNTER — Other Ambulatory Visit (HOSPITAL_COMMUNITY): Payer: Self-pay

## 2022-01-18 MED ORDER — DILAUDID 2 MG PO TABS
2.0000 mg | ORAL_TABLET | ORAL | 0 refills | Status: DC | PRN
  Filled 2022-01-18: qty 90, 15d supply, fill #0

## 2022-02-15 ENCOUNTER — Other Ambulatory Visit (HOSPITAL_COMMUNITY): Payer: Self-pay

## 2022-02-15 MED ORDER — DILAUDID 2 MG PO TABS: 2.0000 mg | ORAL_TABLET | ORAL | 0 refills | Status: AC | PRN

## 2022-02-26 DEATH — deceased

## 2022-02-28 ENCOUNTER — Telehealth: Payer: Self-pay | Admitting: Cardiology

## 2022-04-15 IMAGING — CT CT CHEST-ABD-PELV W/ CM
1 series · 9 of 32 positions shown, 11 images · IV contrast (APPLIED)
Comparison: 01/28/2021

CLINICAL DATA: Ovarian cancer.  Restaging.

EXAM:
CT CHEST, ABDOMEN, AND PELVIS WITH CONTRAST
TECHNIQUE: Multidetector CT imaging of the chest, abdomen and pelvis was
performed following the standard protocol during bolus
administration of intravenous contrast.
CONTRAST:  75mL OMNIPAQUE IOHEXOL 300 MG/ML  SOLN

[Series 5: coronals (person_name) · coronal · 0.82mm/px · 9 of 177 slices shown, 11 images]
[im 7/177  lung]
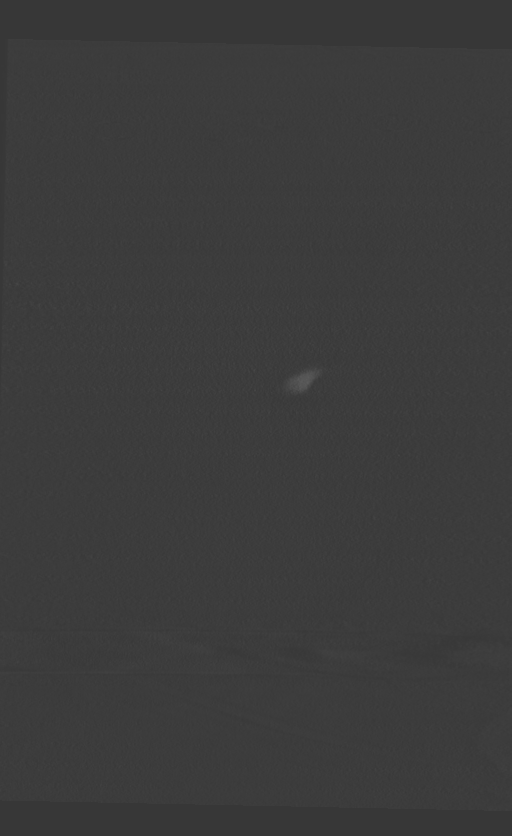
[im 14/177  lung]
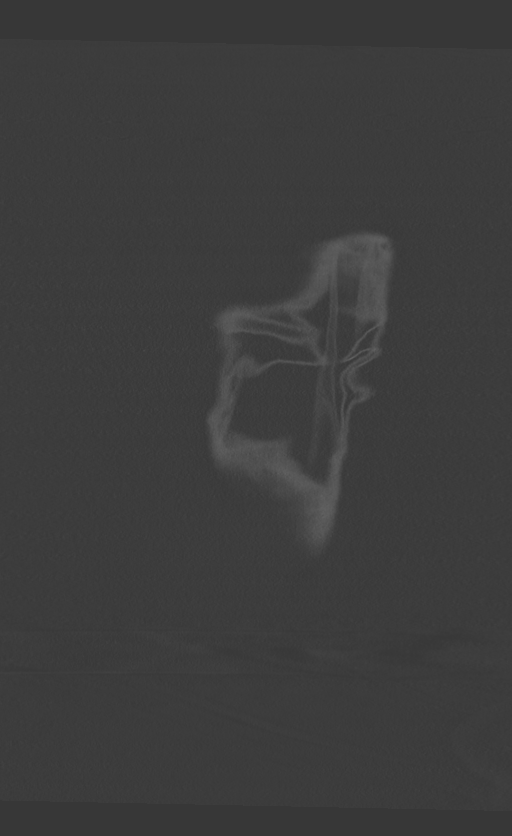
[im 20/177  mediastinal]
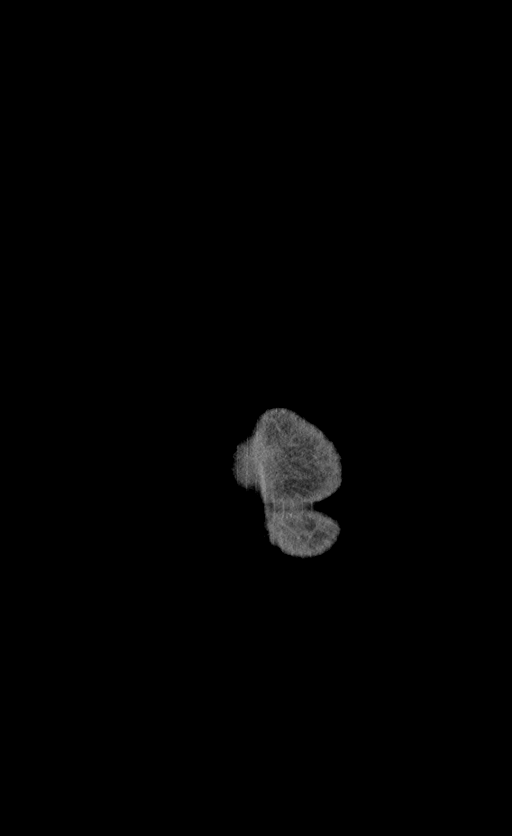
[im 20/177  lung]
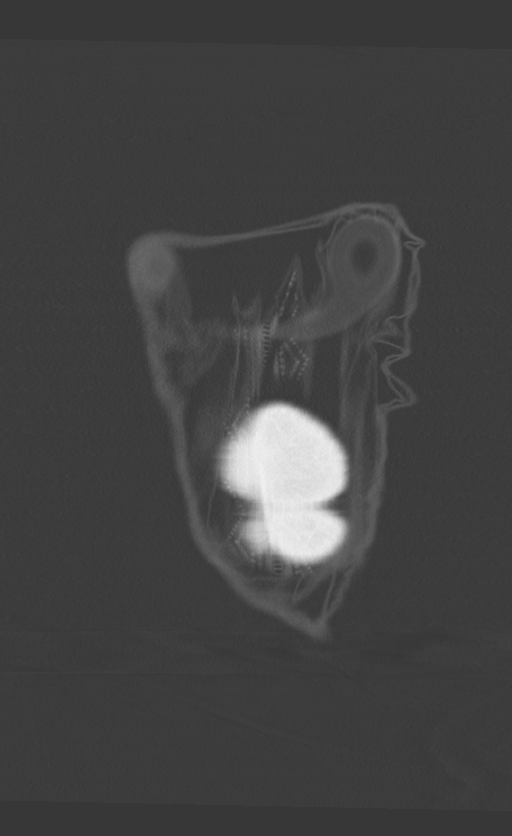
[im 20/177  bone]
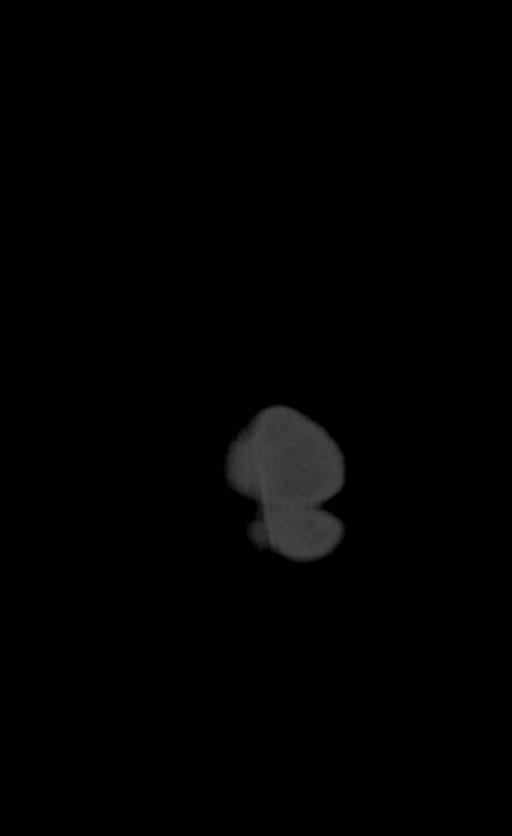
[im 27/177  lung]
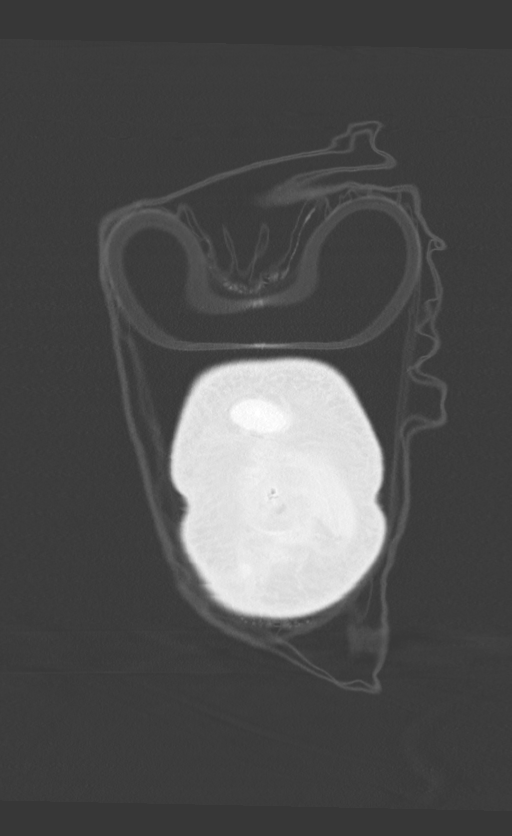
[im 46/177  mediastinal]
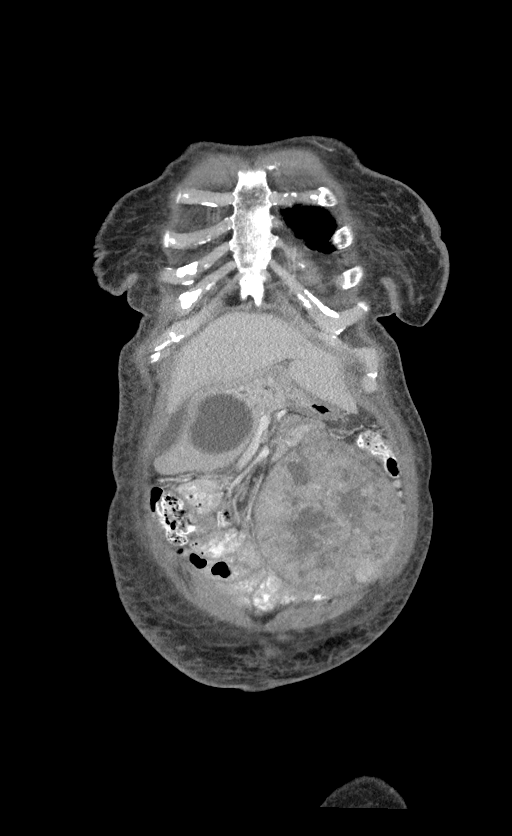
[im 72/177  mediastinal]
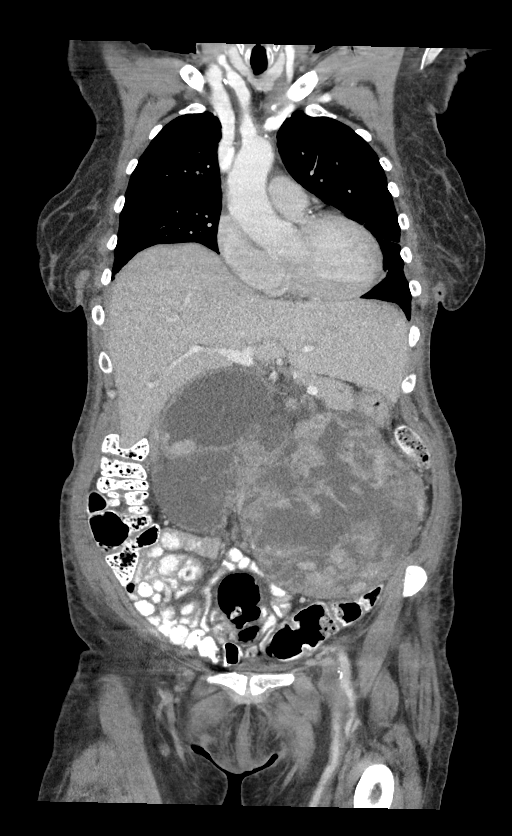
[im 98/177  mediastinal]
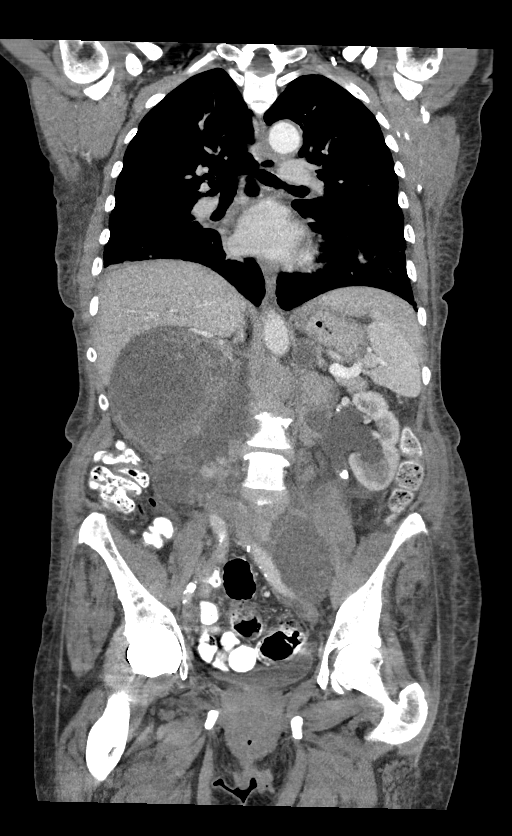
[im 124/177  mediastinal]
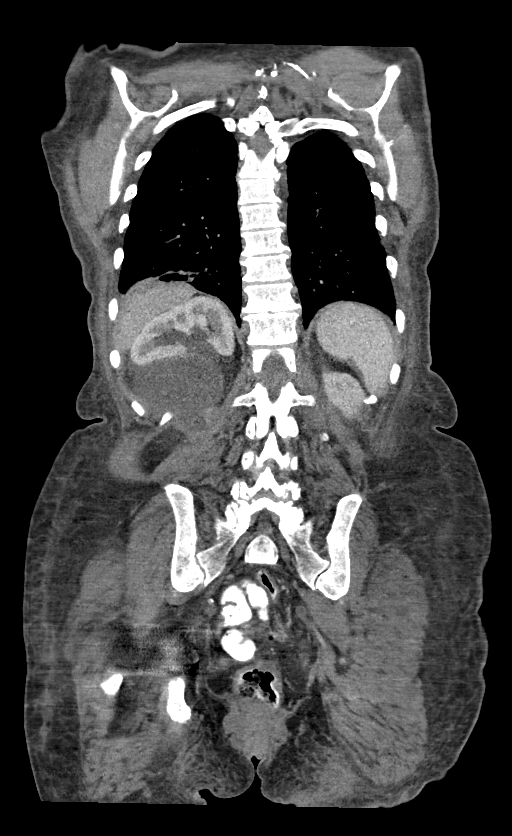
[im 150/177  mediastinal]
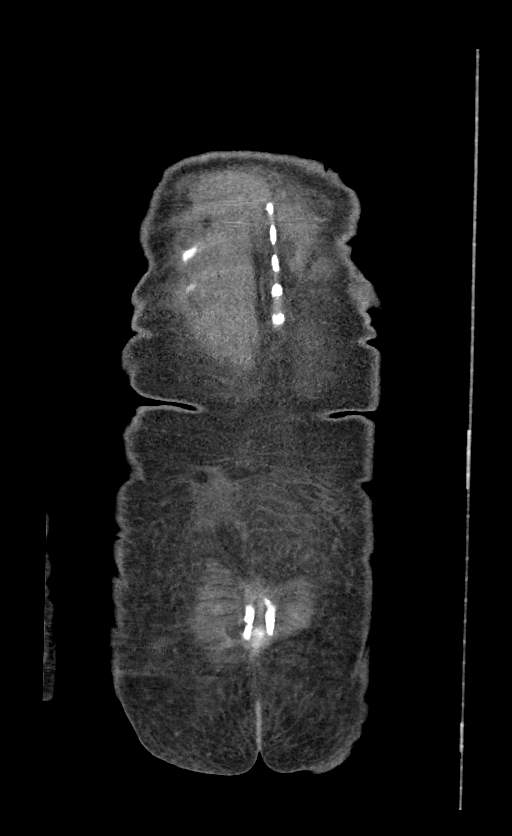

[9 of 32 positions shown; findings below may reference images not displayed]

FINDINGS: CT CHEST FINDINGS

Cardiovascular: Heart size upper normal. No substantial pericardial
effusion. Atherosclerotic calcification is noted in the wall of the
thoracic aorta.

Mediastinum/Nodes: 1.7 cm short axis retroesophageal lymph node seen
on image [DATE] was 1.5 cm short axis previously (remeasured) There is
no hilar lymphadenopathy. There is no axillary lymphadenopathy.

Lungs/Pleura: Interlobular septal thickening seen previously has
decreased in the interval there is some persistent atelectasis in
the lung bases. No new suspicious nodule or mass. No pleural
effusion.

Musculoskeletal: No worrisome lytic or sclerotic osseous
abnormality.

CT ABDOMEN PELVIS FINDINGS

Hepatobiliary: No suspicious focal abnormality within the liver
parenchyma. Tiny hypodensities in the lateral segment left liver are
too small to characterize. Tiny calcified gallstones evident. Mild
intra and extrahepatic biliary duct prominence.

Pancreas: Distorted by mass effect without substantial main duct
dilatation.

Spleen: No splenomegaly. No focal mass lesion.

Adrenals/Urinary Tract: Adrenal glands not well seen. Moderate
bilateral hydronephrosis again noted. 8 x 7 mm stone identified at
or just distal to the left UPJ (image 37/7). Etiology for right
urinary obstruction likely the large posterior right upper quadrant
mass lesion. Bladder is decompressed.

Stomach/Bowel: Nondistended and displaced by mass effect from
abdominal disease. No small bowel obstruction. No colonic
dilatation.

Vascular/Lymphatic: There is abdominal aortic atherosclerosis
without aneurysm. Bulky retroperitoneal lymphadenopathy evident
including a 8.1 x 4.2 cm retroaortic nodal conglomeration on image
58/2. This is difficult to measure on the previous noncontrast study
but is approximately 7.9 x 3.0 cm remeasured on that exam. [DATE] x
cm left common iliac necrotic lymph node on 87/2 was 5.0 x 3.7 cm
previously.

Reproductive: Uterus surgically absent.

Other: Confluent cystic and solid masses are identified in the
abdomen and pelvis. It is difficult to tell on some of these whether
they represent adjacent lesions or confluent multi lobular lesions.
Multicystic process in the right upper quadrant measures 17.8 x
cm on image 61/2 which compares 2 16.7 x 9.8 cm when remeasured in a
similar fashion on the prior study. Dominant left pelvic mixed
attenuation lesion measuring 14.5 x 12.8 cm on image 80/2 was 14.5 x
12.4 cm when remeasured in a similar fashion on the prior study.

Musculoskeletal: Small supraumbilical midline ventral hernia
contains small bowel without complicating features. There is a left
paramidline supraumbilical hernia containing fat and fluid. Status
post right hip replacement. No worrisome lytic or sclerotic osseous
abnormality.
IMPRESSION: 1. Interval mild progression of bulky retroperitoneal and left
common iliac necrotic lymphadenopathy.
2. Confluent cystic and solid masses in the abdomen and pelvis. In
general, these lesions are similar to mildly progressive in the
interval.
3. Retroesophageal lymphadenopathy in the mediastinum is minimally
progressive in the interval.
4. Moderate bilateral hydronephrosis again noted with 8 x 7 mm stone
at or just distal to the left UPJ. Etiology for right urinary
obstruction likely the large posterior right upper quadrant mass
lesion generating mass-effect on the renal pelvis/proximal ureter.
5. Cholelithiasis.
6. Small supraumbilical midline ventral hernia contains small bowel
without complicating features. Left paramidline supraumbilical
hernia containing fat and fluid.
7. Aortic Atherosclerosis ([id number]).
